# Patient Record
Sex: Male | Born: 1990 | Hispanic: No | Marital: Single | Smoking: Current some day smoker
Health system: Southern US, Community
[De-identification: ages and names within clinical notes are randomized; demographics above are authoritative.]

## PROBLEM LIST (undated history)

## (undated) HISTORY — PX: NO PAST SURGERIES: SHX2092

---
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
## 2010-11-24 NOTE — Discharge Summary (Signed)
NAME:  Chad Rojas, RANGANATHAN NO.:  [id number]   MEDICAL RECORD NO.:  [id number]          PATIENT TYPE:  INP   LOCATION:  0201                          FACILITY:  [facility]   PHYSICIAN:  Lalla Brothers, MDDATE OF BIRTH:  December 10, 1990   DATE OF ADMISSION:  10/01/2007  DATE OF DISCHARGE:  10/07/2007                               DISCHARGE SUMMARY   IDENTIFICATION:  A 20 year old male tenth grade student at [employer] was admitted emergently involuntarily on an  [facility] petition for commitment upon transfer from [facility]  Crisis for inpatient stabilization and treatment of suicide risk and  depression.  The patient had been in the emergency department the  preceding evening after attempting to hang himself with a garden hose.  He was not improved the following day when seen at [facility] and was detained  for inpatient treatment, reporting hearing voices telling him to do  things according to mother even though the patient denied it.  For full  details please see the typed admission assessment.   SYNOPSIS OF PRESENT ILLNESS:  The patient has been residing with  mother's friend, Geraldo Pitter, in order to attend school in that  district.  He has a 28 year old girlfriend creating stressors, even  though he does not acknowledge them.  He grieves the death of  grandfather 5 years ago and grandmother 2 years ago.  He has been self-  medicating with alcohol, cannabis and Xanax and Klonopin over the last 2  months, even though he continues to ask for more medicine to help his  anger.  The patient has little insight into his self-defeating patterns  of maladaptive behaviors and compensations for such.  Parents separated  when the patient was 88 years of age and father is in prison for drugs.  Mother works a lot and worries a lot about the patient and his temper.  Mother is separating from her current husband.  Grades have dropped from  As and Bs to Fs in the  last 6 months.  Though the patient wants help, he  does not have time for it.  Mother reports that father had schizophrenia  as well as addiction to cocaine and other drugs.  Mother has  hypertension and there is family history of hyperlipidemia, diabetes,  cancer and heart problems.  The patient did have some therapy with  Frederich Chick three or four years ago and apparently has been treated  with Concerta and Remeron in the past.  Mother is particularly concerned  about the voices, but the patient will not talk about them after  arrival.  He just wants out of the hospital.   He is allergic to PENICILLIN manifest by rash and is intolerant of  dietary cheese.   INITIAL MENTAL STATUS EXAM:  The patient is right-handed and has intact  neurological exam.  There is no evidence of drug withdrawal syndrome.  He has severe dysphoria and an externalizing, disruptive behavior style  with distortion and denial.  He is not homicidal.  He has no anxiety or  dissociation.  There is no delirium.   LABORATORY  FINDINGS:  CBC was normal with white count 8400, hemoglobin  13.3, MCV of 84 and platelet count 262,000.  Basic metabolic panel  revealed creatinine elevated at 1.79 with upper limit of normal 1.5 with  sodium normal at 142, potassium 4.1, CO2 27, random glucose 86, BUN 12  and calcium 9.9.  Total CK was 1464 with reference range 7-232 units per  liter, though serum myoglobin was normal at 87 with normal less than  111.  Repeat basic metabolic panel was normal including creatinine  normalizing to 0.98, and a 24-hour urine creatinine clearance documented  an elevated 24-hour urine creatinine of 2753 mg per day with upper limit  of normal 2000, so that his rate was 195 mL/min., above the normal range  of 75-125.  Hepatic function panel on admission revealed AST elevated at  77, likely associated with elevated CK, with upper limit of normal 37.  ALT was normal at 47, albumin 4, but indirect  bilirubin was elevated at  1.1 with upper limit of normal 0.9.  Repeat hepatic function panel  revealed indirect bilirubin still elevated at 1.0 with AST down to 40  three days later.  Free T4 was normal at 1.4 with reference range 0.89-  1.8.  TSH was elevated at 10.989 with reference range 0.35-5.5.  Thyroid  antibodies were negative and free T3 was normal at 4.1 with reference  range 2.3-4.2.  RPR was negative and urine probe for gonorrhea and  chlamydia by DNA amplification were both negative.  Urine drug screen  was positive for marijuana metabolites, quantitated and confirmed at 71  ng/mL.  Otherwise urine drug screen was negative with creatinine of 352  mg/dL documenting adequate specimen.  Urinalysis on admission was a  concentrated specimen with specific gravity of 1.032 with trace of  ketones.  GGT was normal at 34.  Ten -hour fasting lipid panel revealed  HDL cholesterol low at 28 with normal being greater than 34 mg/dL, with  LDL normal at 95, total cholesterol 148, VLDL 25 and triglycerides 127.  Hemoglobin A1c was normal at 5.6% with reference range is 4.6-6.1.   HOSPITAL COURSE AND TREATMENT:  General medical exam by Jorje Guild, PA-C,  noted a nasal fracture at age 20 and a fracture of the right thumb at  age 20 without other frequent or pathological fractures.  The patient  considered his grades all right though decreased.  He gained 9 pounds in  the last month by his own review.  He reported some muscle strain in the  low back and right knee.  BMI was 30.9.  He is sexually active.  He was  afebrile throughout the hospital stay with maximum temperature of 98.3.  His height was 174 cm.  Admission weight was 93.5 kg and discharge  weight 94 kg.  Blood pressure initially was 112/56 with heart rate of 56  sitting and 109/47 with heart rate of 51 standing.  On the day of  discharge, supine blood pressure was 136/59 with heart rate of 58 and  standing blood pressure 146/75 with  heart rate of 81.  The patient was  highly stressed, demanding discharge, requiring mother to visit or call  frequently.  He started Wellbutrin, titrated up to 300 mg XL and then  450 mg XL every other day for starting with 150 mg XL.  He started  Zyprexa 10 mg nightly.  On this medication regimen, the patient was able  to cooperate with nutrition, sleep, and dealing with  his back pain  without self-medicating.  His metabolic parameters normalized and he  acknowledged using a gym regimen and likely protein and energy  supplements that may well account for relative thyroid suppression with  elevation of the TSH as well as his high creatine kinase and creatinine  body load that ultimately suggests that he had excess creatinine rather  than any impairment of renal function.  Whether this also contributed to  his psychiatric decompensation could not be more fully determined with  the patient appearing to have a major depressive episode and drug abuse  that caused the symptoms to arise with the metabolic findings being  predominately incidental.  He did not manifest schizophrenia, though we  understand his father had a history of such and mother was worried about  that.  The patient improved on medications and tolerated the completion  of the hospital treatment program, as did mother, even though mother  found herself having to advocate for early discharge because the patient  put pressure on her.  In the end they achieved adequate family  intervention and the patient agreed to move back to mother's home.  He  realized by the time of discharge that his girlfriend, age 51, had been  a bad influence because she has a long history of substance abuse.  They  planned family counseling and activities and they gradually began  processing the losses of grandparents.  Mother and patient processed  that he has several football scholarship offers if he gets his grades  up, and he was motivated by the time  of discharge to function better.  Zyprexa is planned for 1-2 months but Wellbutrin should probably be 6-12  months.  He required no seclusion or restraint by the time of discharge.   FINAL DIAGNOSIS:  AXIS I:  1.  Major depression, single episode, severe.  2.  Oppositional defiant disorder.  3.  Attention deficit-hyperactivity  disorder, combined subtype, moderate severity.  4.  Polysubstance abuse.  5.  Other interpersonal problem.  6.  Other specified family  circumstances.  7.  Parent-child problem.  8.  Noncompliance with  treatment.  AXIS II:  Diagnosis deferred.  AXIS III:  1.  Allergy to PENICILLIN manifested by rash.  2.  Dietary  intolerance to cheese, likely lactose.  3.  Overweight with low HDL  cholesterol of 28 mg/dL.  4.  Elevated creatinine clearance, AST, and CK  as well as elevation of TSH, likely associated with his gym workout  program including supplements.  5.  Mechanical low back pain from  strain.  AXIS IV:  Stressors:  Family severe, acute and chronic; school moderate,  acute and chronic; phase of life severe, acute and chronic; peer  relations extreme, acute and chronic.  AXIS V:  GAF on admission 32 with highest in last year 62 and discharge  GAF was 53.   PLAN:  The patient was discharged to mother in an improved condition,  free of suicidal ideation and having no auditory hallucinations.  He  follows a weight control diet and exercise program for restoring normal  HDL cholesterol and weight as possible.  He has no restrictions on  physical activity other than in need of a physical medicine rehab  program for his low back strain.  He requires no wound care or pain  management.  He will discontinue any energy and protein supplements.  Crisis safety plans are outlined if needed.  He will maintain sobriety  from illicit drugs.  He is discharged on the following medication:  1. Wellbutrin 150 mg XL tablet as three every morning, quantity #90      with one  refill prescribed.  2. Zyprexa 10 mg every bedtime for a couple months, quantity #30 with      one refill prescribed.   They are educated on the medication including FDA guidelines and  warnings.   Psychiatric follow-up will be with [facility] at [phone], with appointment October 08, 2007, at 1100.  They will have  therapy with Frederich Chick at [phone] with appointment October 21, 2007,  at 0830.      Lalla Brothers, MD  Electronically Signed     GEJ/MEDQ  D:  10/09/2007  T:  10/10/2007  Job:  [id number]   cc:   [facility]  [address]  [place], [place] [address]  FAX [phone]   Dr. Waynetta Pean [facility]  [address]  [place], [place] [address]  Valinda Hoar [phone]

## 2011-04-02 LAB — THC (MARIJUANA), URINE, CONFIRMATION: Marijuana, Ur-Confirmation: 71 ng/mL

## 2011-04-02 LAB — HEPATIC FUNCTION PANEL
ALT: 40
AST: 77 — ABNORMAL HIGH
Alkaline Phosphatase: 45 — ABNORMAL LOW
Bilirubin, Direct: 0.1
Bilirubin, Direct: 0.2
Indirect Bilirubin: 1 — ABNORMAL HIGH
Indirect Bilirubin: 1.1 — ABNORMAL HIGH
Total Bilirubin: 1.3 — ABNORMAL HIGH
Total Protein: 7

## 2011-04-02 LAB — URINALYSIS, ROUTINE W REFLEX MICROSCOPIC
Glucose, UA: NEGATIVE
Nitrite: NEGATIVE
Protein, ur: NEGATIVE
pH: 6

## 2011-04-02 LAB — BASIC METABOLIC PANEL
Calcium: 9.1
Calcium: 9.9
Glucose, Bld: 86
Glucose, Bld: 93
Potassium: 3.7
Potassium: 4.1
Sodium: 140
Sodium: 142

## 2011-04-02 LAB — DRUGS OF ABUSE SCREEN W/O ALC, ROUTINE URINE
Amphetamine Screen, Ur: NEGATIVE
Barbiturate Quant, Ur: NEGATIVE
Marijuana Metabolite: POSITIVE — AB
Propoxyphene: NEGATIVE

## 2011-04-02 LAB — T4, FREE: Free T4: 1.4

## 2011-04-02 LAB — DIFFERENTIAL
Basophils Relative: 0
Eosinophils Absolute: 0.2
Monocytes Relative: 9
Neutro Abs: 3.7
Neutrophils Relative %: 44

## 2011-04-02 LAB — CREATININE CLEARANCE, URINE, 24 HOUR
Creatinine, Urine: 423.5
Urine Total Volume-CRCL: 650

## 2011-04-02 LAB — MYOGLOBIN, SERUM: Myoglobin: 87

## 2011-04-02 LAB — GAMMA GT
GGT: 27
GGT: 34

## 2011-04-02 LAB — LIPID PANEL
LDL Cholesterol: 95
Total CHOL/HDL Ratio: 5.3
VLDL: 25

## 2011-04-02 LAB — CBC
MCHC: 34.3
MCV: 84
Platelets: 262
RBC: 4.61
WBC: 8.4

## 2011-04-02 LAB — RPR: RPR Ser Ql: NONREACTIVE

## 2011-04-02 LAB — HEMOGLOBIN A1C: Hgb A1c MFr Bld: 5.6

## 2011-04-02 LAB — GC/CHLAMYDIA PROBE AMP, URINE: GC Probe Amp, Urine: NEGATIVE

## 2011-04-02 LAB — TSH: TSH: 10.989 — ABNORMAL HIGH

## 2011-04-02 LAB — THYROID ANTIBODIES: Thyroperoxidase Ab SerPl-aCnc: <25 U/mL

## 2011-04-02 LAB — T3, FREE: T3, Free: 4.1 (ref 2.3–4.2)

## 2011-04-02 LAB — CK: Total CK: 1464 — ABNORMAL HIGH

## 2012-08-11 ENCOUNTER — Emergency Department: Payer: Self-pay | Admitting: Unknown Physician Specialty

## 2013-04-16 ENCOUNTER — Emergency Department: Payer: Self-pay | Admitting: Emergency Medicine

## 2015-06-10 ENCOUNTER — Emergency Department
Admission: EM | Admit: 2015-06-10 | Discharge: 2015-06-10 | Disposition: A | Discharge status: Home / Self Care | Payer: Medicaid Other | Attending: Emergency Medicine | Admitting: Emergency Medicine

## 2015-06-10 ENCOUNTER — Encounter: Payer: Self-pay | Admitting: *Deleted

## 2015-06-10 DIAGNOSIS — F17200 Nicotine dependence, unspecified, uncomplicated: Secondary | ICD-10-CM | POA: Insufficient documentation

## 2015-06-10 DIAGNOSIS — Y9389 Activity, other specified: Secondary | ICD-10-CM | POA: Diagnosis not present

## 2015-06-10 DIAGNOSIS — Y998 Other external cause status: Secondary | ICD-10-CM | POA: Diagnosis not present

## 2015-06-10 DIAGNOSIS — S0592XA Unspecified injury of left eye and orbit, initial encounter: Secondary | ICD-10-CM | POA: Diagnosis present

## 2015-06-10 DIAGNOSIS — X58XXXA Exposure to other specified factors, initial encounter: Secondary | ICD-10-CM | POA: Insufficient documentation

## 2015-06-10 DIAGNOSIS — S0502XA Injury of conjunctiva and corneal abrasion without foreign body, left eye, initial encounter: Secondary | ICD-10-CM

## 2015-06-10 DIAGNOSIS — Y9289 Other specified places as the place of occurrence of the external cause: Secondary | ICD-10-CM | POA: Insufficient documentation

## 2015-06-10 DIAGNOSIS — Z880 Allergy status to penicillin: Secondary | ICD-10-CM | POA: Diagnosis not present

## 2015-06-10 MED ORDER — GENTAMICIN SULFATE 0.3 % OP SOLN: 1.0000 [drp] | Freq: Four times a day (QID) | OPHTHALMIC | Status: AC

## 2015-06-10 MED ORDER — EYE WASH OPHTH SOLN
OPHTHALMIC | Status: AC
  Administered 2015-06-10: 18:00:00
  Filled 2015-06-10: qty 118

## 2015-06-10 MED ORDER — TETRACAINE HCL 0.5 % OP SOLN
OPHTHALMIC | Status: AC
  Administered 2015-06-10: 18:00:00
  Filled 2015-06-10: qty 2

## 2015-06-10 MED ORDER — FLUORESCEIN SODIUM 1 MG OP STRP
ORAL_STRIP | OPHTHALMIC | Status: AC
  Administered 2015-06-10: 18:00:00
  Filled 2015-06-10: qty 1

## 2015-06-10 NOTE — ED Provider Notes (Signed)
[facility] Emergency Department Provider Note  ____________________________________________  Time seen: Approximately 5:31 PM  I have reviewed the triage vital signs and the nursing notes.   HISTORY  Chief Complaint Eye Pain    HPI Chad Rojas is a 24 y.o. male 5 patient admitted 2 days.  Patient said he woke up yesterday morning with left eye pain. Patient does not remember any trauma. Patient state foreign body sensation in the eye. Patient is painful to open left eye. No palliative measures for complaint. Patient rates pain as a 10 over 10. History reviewed. No pertinent past medical history.  There are no active problems to display for this patient.   History reviewed. No pertinent past surgical history.  Current Outpatient Rx  Name  Route  Sig  Dispense  Refill  . gentamicin (GARAMYCIN) 0.3 % ophthalmic solution   Left Eye   Place 1 drop into the left eye 4 (four) times daily.   5 mL   0     Allergies Penicillins  No family history on file.  Social History Social History  Substance Use Topics  . Smoking status: Current Every Day Smoker  . Smokeless tobacco: None  . Alcohol Use: Yes    Review of Systems Constitutional: No fever/chills Eyes: No visual changes. Left eye pain. ENT: No sore throat. Cardiovascular: Denies chest pain. Respiratory: Denies shortness of breath. Gastrointestinal: No abdominal pain.  No nausea, no vomiting.  No diarrhea.  No constipation. Genitourinary: Negative for dysuria. Musculoskeletal: Negative for back pain. Skin: Negative for rash. Neurological: Negative for headaches, focal weakness or numbness. 10-point ROS otherwise negative.  ____________________________________________   PHYSICAL EXAM:  VITAL SIGNS: ED Triage Vitals  Enc Vitals Group     BP 06/10/15 1559 156/102 mmHg     Pulse Rate 06/10/15 1559 69     Resp 06/10/15 1559 20     Temp 06/10/15 1559 98.3 F (36.8 C)     Temp  Source 06/10/15 1559 Oral     SpO2 06/10/15 1559 97 %     Weight 06/10/15 1559 220 lb (99.791 kg)     Height 06/10/15 1559 6' (1.829 m)     Head Cir --      Peak Flow --      Pain Score 06/10/15 1559 10     Pain Loc --      Pain Edu? --      Excl. in GC? --     Constitutional: Alert and oriented. Well appearing and in no acute distress. Eyes: Unable to measure vision acuity left eye secondary to complain of pain and photophobia. Fluoro- stain revealed center cornea abrasion approximately 2 mm. No visible foreign body. Head: Atraumatic. Nose: No congestion/rhinnorhea. Mouth/Throat: Mucous membranes are moist.  Oropharynx non-erythematous. Neck: No stridor.  No cervical spine tenderness to palpation. Hematological/Lymphatic/Immunilogical: No cervical lymphadenopathy. Cardiovascular: Normal rate, regular rhythm. Grossly normal heart sounds.  Good peripheral circulation. Respiratory: Normal respiratory effort.  No retractions. Lungs CTAB. Gastrointestinal: Soft and nontender. No distention. No abdominal bruits. No CVA tenderness. Genitourinary:  Musculoskeletal: No lower extremity tenderness nor edema.  No joint effusions. Neurologic:  Normal speech and language. No gross focal neurologic deficits are appreciated. No gait instability. Skin:  Skin is warm, dry and intact. No rash noted. Psychiatric: Mood and affect are normal. Speech and behavior are normal.  ____________________________________________   LABS (all labs ordered are listed, but only abnormal results are displayed)  Labs Reviewed - No data to  display ____________________________________________  EKG  ____________________________________________  RADIOLOGY   ____________________________________________   PROCEDURES  Procedure(s) performed: None  Critical Care performed: No  ____________________________________________   INITIAL IMPRESSION / ASSESSMENT AND PLAN / ED COURSE  Pertinent labs & imaging  results that were available during my care of the patient were reviewed by me and considered in my medical decision making (see chart for details).  Left corneal abrasion. Discussed sequela of injury to patient. Patient given prescription for gentamicin ophthalmic eyedrops use as directed. Patient advised [facility] with [facility] if no improvement or worsening of his complaint the next 2-3 days. ____________________________________________   FINAL CLINICAL IMPRESSION(S) / ED DIAGNOSES  Final diagnoses:  Left corneal abrasion, initial encounter       Joni Reining, PA-C 06/10/15 1804  Loleta Rose, MD 06/10/15 2357

## 2015-06-10 NOTE — Discharge Instructions (Signed)
Corneal Abrasion °The cornea is the clear covering at the front and center of the eye. When you look at the colored portion of the eye, you are looking through the cornea. It is a thin tissue made up of layers. The top layer is the most sensitive layer. A corneal abrasion happens if this layer is scratched or an injury causes it to come off.  °HOME CARE °· You may be given drops or a medicated cream. Use the medicine as told by your doctor. °· A pressure patch may be put over the eye. If this is done, follow your doctor's instructions for when to remove the patch. Do not drive or use machines while the eye patch is on. Judging distances is hard to do with a patch on. °· See your doctor for a follow-up exam if you are told to do so. It is very important that you keep this appointment. °GET HELP IF:  °· You have pain, are sensitive to light, and have a scratchy feeling in one eye or both eyes. °· Your pressure patch keeps getting loose. You can blink your eye under the patch. °· You have fluid coming from your eye or the lids stick together in the morning. °· You have the same symptoms in the morning that you did with the first abrasion. This could be days, weeks, or months after the first abrasion healed. °  °This information is not intended to replace advice given to you by your health care provider. Make sure you discuss any questions you have with your health care provider. °  °Document Released: 12/12/2007 Document Revised: 03/16/2015 Document Reviewed: 03/02/2013 °Elsevier Interactive Patient Education ©2016 Elsevier Inc. ° °

## 2015-06-10 NOTE — ED Notes (Signed)
Right eye 20/25, left eye unable to open

## 2016-02-20 ENCOUNTER — Emergency Department (HOSPITAL_COMMUNITY)
Admission: EM | Admit: 2016-02-20 | Discharge: 2016-02-21 | Disposition: A | Discharge status: Other Acute Inpatient Hospital | Payer: Medicaid Other | Attending: Emergency Medicine | Admitting: Emergency Medicine

## 2016-02-20 ENCOUNTER — Encounter (HOSPITAL_COMMUNITY): Payer: Self-pay | Admitting: Emergency Medicine

## 2016-02-20 DIAGNOSIS — F17210 Nicotine dependence, cigarettes, uncomplicated: Secondary | ICD-10-CM | POA: Diagnosis not present

## 2016-02-20 DIAGNOSIS — Z5181 Encounter for therapeutic drug level monitoring: Secondary | ICD-10-CM | POA: Insufficient documentation

## 2016-02-20 DIAGNOSIS — F1910 Other psychoactive substance abuse, uncomplicated: Secondary | ICD-10-CM

## 2016-02-20 DIAGNOSIS — F1210 Cannabis abuse, uncomplicated: Secondary | ICD-10-CM | POA: Insufficient documentation

## 2016-02-20 DIAGNOSIS — R45851 Suicidal ideations: Secondary | ICD-10-CM | POA: Diagnosis not present

## 2016-02-20 DIAGNOSIS — F1410 Cocaine abuse, uncomplicated: Secondary | ICD-10-CM | POA: Diagnosis not present

## 2016-02-20 LAB — ETHANOL

## 2016-02-20 LAB — CBC
HCT: 38 % — ABNORMAL LOW (ref 39.0–52.0)
Hemoglobin: 12.6 g/dL — ABNORMAL LOW (ref 13.0–17.0)
MCH: 29 pg (ref 26.0–34.0)
MCHC: 33.2 g/dL (ref 30.0–36.0)
MCV: 87.4 fL (ref 78.0–100.0)
PLATELETS: 206 10*3/uL (ref 150–400)
RBC: 4.35 MIL/uL (ref 4.22–5.81)
RDW: 12.5 % (ref 11.5–15.5)
WBC: 6.8 10*3/uL (ref 4.0–10.5)

## 2016-02-20 LAB — COMPREHENSIVE METABOLIC PANEL
ALT: 31 U/L (ref 17–63)
AST: 42 U/L — AB (ref 15–41)
Albumin: 3.7 g/dL (ref 3.5–5.0)
Alkaline Phosphatase: 40 U/L (ref 38–126)
Anion gap: 6 (ref 5–15)
BILIRUBIN TOTAL: 1.2 mg/dL (ref 0.3–1.2)
BUN: 8 mg/dL (ref 6–20)
CHLORIDE: 105 mmol/L (ref 101–111)
CO2: 26 mmol/L (ref 22–32)
Calcium: 8.9 mg/dL (ref 8.9–10.3)
Creatinine, Ser: 1.02 mg/dL (ref 0.61–1.24)
Glucose, Bld: 128 mg/dL — ABNORMAL HIGH (ref 65–99)
POTASSIUM: 3.5 mmol/L (ref 3.5–5.1)
Sodium: 137 mmol/L (ref 135–145)
TOTAL PROTEIN: 6.4 g/dL — AB (ref 6.5–8.1)

## 2016-02-20 LAB — RAPID URINE DRUG SCREEN, HOSP PERFORMED
Amphetamines: NOT DETECTED
Barbiturates: NOT DETECTED
Benzodiazepines: POSITIVE — AB
Cocaine: POSITIVE — AB
OPIATES: POSITIVE — AB
Tetrahydrocannabinol: POSITIVE — AB

## 2016-02-20 LAB — ACETAMINOPHEN LEVEL: Acetaminophen (Tylenol), Serum: <10 ug/mL — ABNORMAL LOW (ref 10–30)

## 2016-02-20 LAB — SALICYLATE LEVEL

## 2016-02-20 NOTE — ED Triage Notes (Signed)
Uses cocaine and marijuana; wants to get clean. Having suicidal thoughts. Reports has a plan to hang himself. Fell last night and is having back pain also.

## 2016-02-20 NOTE — ED Notes (Signed)
Given Bag meal

## 2016-02-21 ENCOUNTER — Emergency Department (HOSPITAL_COMMUNITY): Payer: Medicaid Other

## 2016-02-21 ENCOUNTER — Inpatient Hospital Stay (HOSPITAL_COMMUNITY)
Admission: EM | Admit: 2016-02-21 | Discharge: 2016-02-26 | DRG: 897 | Disposition: A | Discharge status: Home / Self Care | Payer: Medicaid Other | Source: Intra-hospital | Attending: Psychiatry | Admitting: Psychiatry

## 2016-02-21 ENCOUNTER — Encounter (HOSPITAL_COMMUNITY): Payer: Self-pay | Admitting: *Deleted

## 2016-02-21 DIAGNOSIS — Z915 Personal history of self-harm: Secondary | ICD-10-CM | POA: Diagnosis not present

## 2016-02-21 DIAGNOSIS — F329 Major depressive disorder, single episode, unspecified: Secondary | ICD-10-CM | POA: Diagnosis present

## 2016-02-21 DIAGNOSIS — F1424 Cocaine dependence with cocaine-induced mood disorder: Principal | ICD-10-CM | POA: Diagnosis present

## 2016-02-21 DIAGNOSIS — R45851 Suicidal ideations: Secondary | ICD-10-CM

## 2016-02-21 DIAGNOSIS — F1920 Other psychoactive substance dependence, uncomplicated: Secondary | ICD-10-CM | POA: Diagnosis present

## 2016-02-21 DIAGNOSIS — F1220 Cannabis dependence, uncomplicated: Secondary | ICD-10-CM | POA: Clinically undetermined

## 2016-02-21 DIAGNOSIS — F1310 Sedative, hypnotic or anxiolytic abuse, uncomplicated: Secondary | ICD-10-CM | POA: Clinically undetermined

## 2016-02-21 DIAGNOSIS — F11229 Opioid dependence with intoxication, unspecified: Secondary | ICD-10-CM | POA: Diagnosis present

## 2016-02-21 DIAGNOSIS — F1410 Cocaine abuse, uncomplicated: Secondary | ICD-10-CM | POA: Diagnosis not present

## 2016-02-21 DIAGNOSIS — F12288 Cannabis dependence with other cannabis-induced disorder: Secondary | ICD-10-CM | POA: Diagnosis present

## 2016-02-21 DIAGNOSIS — F1420 Cocaine dependence, uncomplicated: Secondary | ICD-10-CM | POA: Clinically undetermined

## 2016-02-21 DIAGNOSIS — F1120 Opioid dependence, uncomplicated: Secondary | ICD-10-CM | POA: Clinically undetermined

## 2016-02-21 DIAGNOSIS — F17210 Nicotine dependence, cigarettes, uncomplicated: Secondary | ICD-10-CM | POA: Diagnosis present

## 2016-02-21 DIAGNOSIS — F19929 Other psychoactive substance use, unspecified with intoxication, unspecified: Secondary | ICD-10-CM

## 2016-02-21 DIAGNOSIS — F1994 Other psychoactive substance use, unspecified with psychoactive substance-induced mood disorder: Secondary | ICD-10-CM | POA: Diagnosis not present

## 2016-02-21 DIAGNOSIS — F3289 Other specified depressive episodes: Secondary | ICD-10-CM

## 2016-02-21 MED ORDER — ADULT MULTIVITAMIN W/MINERALS CH
1.0000 | ORAL_TABLET | Freq: Every day | ORAL | Status: DC
  Administered 2016-02-21 – 2016-02-26 (×6): 1 via ORAL
  Filled 2016-02-21 (×10): qty 1

## 2016-02-21 MED ORDER — NICOTINE 21 MG/24HR TD PT24
21.0000 mg | MEDICATED_PATCH | Freq: Every day | TRANSDERMAL | Status: DC
  Filled 2016-02-21 (×3): qty 1

## 2016-02-21 MED ORDER — PNEUMOCOCCAL VAC POLYVALENT 25 MCG/0.5ML IJ INJ: 0.5000 mL | INJECTION | INTRAMUSCULAR | Status: DC

## 2016-02-21 MED ORDER — ACETAMINOPHEN 325 MG PO TABS
650.0000 mg | ORAL_TABLET | Freq: Four times a day (QID) | ORAL | Status: DC | PRN
  Administered 2016-02-21 – 2016-02-24 (×6): 650 mg via ORAL
  Filled 2016-02-21 (×7): qty 2

## 2016-02-21 MED ORDER — LORAZEPAM 1 MG PO TABS
1.0000 mg | ORAL_TABLET | Freq: Four times a day (QID) | ORAL | Status: DC
  Administered 2016-02-21 – 2016-02-22 (×3): 1 mg via ORAL
  Filled 2016-02-21 (×4): qty 1

## 2016-02-21 MED ORDER — ONDANSETRON 4 MG PO TBDP: 4.0000 mg | ORAL_TABLET | Freq: Four times a day (QID) | ORAL | Status: AC | PRN

## 2016-02-21 MED ORDER — THIAMINE HCL 100 MG/ML IJ SOLN: 100.0000 mg | Freq: Once | INTRAMUSCULAR | Status: DC

## 2016-02-21 MED ORDER — LORAZEPAM 1 MG PO TABS: 1.0000 mg | ORAL_TABLET | Freq: Two times a day (BID) | ORAL | Status: DC

## 2016-02-21 MED ORDER — CLONIDINE HCL 0.1 MG PO TABS
0.1000 mg | ORAL_TABLET | ORAL | Status: AC
  Administered 2016-02-23 – 2016-02-25 (×4): 0.1 mg via ORAL
  Filled 2016-02-21 (×4): qty 1

## 2016-02-21 MED ORDER — TRAZODONE HCL 50 MG PO TABS
50.0000 mg | ORAL_TABLET | Freq: Every day | ORAL | Status: DC
  Filled 2016-02-21 (×2): qty 1

## 2016-02-21 MED ORDER — LORAZEPAM 1 MG PO TABS
1.0000 mg | ORAL_TABLET | Freq: Three times a day (TID) | ORAL | Status: DC
  Filled 2016-02-21: qty 1

## 2016-02-21 MED ORDER — LOPERAMIDE HCL 2 MG PO CAPS
2.0000 mg | ORAL_CAPSULE | ORAL | Status: AC | PRN
Start: 2016-02-21 — End: 2016-02-26
  Administered 2016-02-24: 2 mg via ORAL
  Filled 2016-02-21: qty 1

## 2016-02-21 MED ORDER — METHOCARBAMOL 500 MG PO TABS
500.0000 mg | ORAL_TABLET | Freq: Three times a day (TID) | ORAL | Status: AC | PRN
  Administered 2016-02-21 – 2016-02-26 (×6): 500 mg via ORAL
  Filled 2016-02-21 (×6): qty 1

## 2016-02-21 MED ORDER — VITAMIN B-1 100 MG PO TABS
100.0000 mg | ORAL_TABLET | Freq: Every day | ORAL | Status: DC
  Administered 2016-02-22 – 2016-02-26 (×5): 100 mg via ORAL
  Filled 2016-02-21 (×8): qty 1

## 2016-02-21 MED ORDER — KETOROLAC TROMETHAMINE 60 MG/2ML IM SOLN
30.0000 mg | Freq: Once | INTRAMUSCULAR | Status: DC
  Filled 2016-02-21: qty 2

## 2016-02-21 MED ORDER — DIPHENHYDRAMINE HCL 50 MG/ML IJ SOLN
50.0000 mg | Freq: Once | INTRAMUSCULAR | Status: AC
  Administered 2016-02-21: 50 mg via INTRAMUSCULAR
  Filled 2016-02-21 (×2): qty 1

## 2016-02-21 MED ORDER — ALUM & MAG HYDROXIDE-SIMETH 200-200-20 MG/5ML PO SUSP: 30.0000 mL | ORAL | Status: DC | PRN

## 2016-02-21 MED ORDER — VITAMIN B-1 100 MG PO TABS
100.0000 mg | ORAL_TABLET | Freq: Once | ORAL | Status: AC
  Administered 2016-02-21: 100 mg via ORAL
  Filled 2016-02-21 (×2): qty 1

## 2016-02-21 MED ORDER — LORAZEPAM 2 MG/ML IJ SOLN
1.0000 mg | Freq: Once | INTRAMUSCULAR | Status: AC
  Administered 2016-02-21: 1 mg via INTRAMUSCULAR
  Filled 2016-02-21: qty 1

## 2016-02-21 MED ORDER — HYDROXYZINE HCL 25 MG PO TABS
25.0000 mg | ORAL_TABLET | Freq: Four times a day (QID) | ORAL | Status: AC | PRN
  Administered 2016-02-21 – 2016-02-24 (×6): 25 mg via ORAL
  Filled 2016-02-21 (×7): qty 1

## 2016-02-21 MED ORDER — ACETAMINOPHEN 500 MG PO TABS
1000.0000 mg | ORAL_TABLET | Freq: Once | ORAL | Status: AC
  Administered 2016-02-21: 1000 mg via ORAL
  Filled 2016-02-21: qty 2

## 2016-02-21 MED ORDER — NAPROXEN 500 MG PO TABS
500.0000 mg | ORAL_TABLET | Freq: Two times a day (BID) | ORAL | Status: AC | PRN
  Administered 2016-02-21 – 2016-02-25 (×5): 500 mg via ORAL
  Filled 2016-02-21 (×5): qty 1

## 2016-02-21 MED ORDER — DICYCLOMINE HCL 20 MG PO TABS
20.0000 mg | ORAL_TABLET | Freq: Four times a day (QID) | ORAL | Status: AC | PRN
  Administered 2016-02-24 – 2016-02-25 (×2): 20 mg via ORAL
  Filled 2016-02-21 (×2): qty 1

## 2016-02-21 MED ORDER — CLONIDINE HCL 0.1 MG PO TABS
0.1000 mg | ORAL_TABLET | Freq: Every day | ORAL | Status: DC
  Administered 2016-02-26: 0.1 mg via ORAL
  Filled 2016-02-21 (×2): qty 1

## 2016-02-21 MED ORDER — ZIPRASIDONE MESYLATE 20 MG IM SOLR
20.0000 mg | Freq: Once | INTRAMUSCULAR | Status: AC
  Administered 2016-02-21: 20 mg via INTRAMUSCULAR
  Filled 2016-02-21 (×2): qty 20

## 2016-02-21 MED ORDER — LORAZEPAM 1 MG PO TABS: 1.0000 mg | ORAL_TABLET | Freq: Every day | ORAL | Status: DC

## 2016-02-21 MED ORDER — LORAZEPAM 1 MG PO TABS
1.0000 mg | ORAL_TABLET | Freq: Four times a day (QID) | ORAL | Status: AC | PRN
  Filled 2016-02-21: qty 1

## 2016-02-21 MED ORDER — MAGNESIUM HYDROXIDE 400 MG/5ML PO SUSP: 30.0000 mL | Freq: Every day | ORAL | Status: DC | PRN

## 2016-02-21 MED ORDER — CLONIDINE HCL 0.1 MG PO TABS
0.1000 mg | ORAL_TABLET | Freq: Four times a day (QID) | ORAL | Status: AC
  Administered 2016-02-21 – 2016-02-23 (×6): 0.1 mg via ORAL
  Filled 2016-02-21 (×10): qty 1

## 2016-02-21 MED ORDER — RISPERIDONE 1 MG PO TABS
1.0000 mg | ORAL_TABLET | Freq: Every day | ORAL | Status: DC
  Filled 2016-02-21: qty 1

## 2016-02-21 NOTE — ED Notes (Signed)
Ordered breakfast tray  

## 2016-02-21 NOTE — H&P (Signed)
Psychiatric Admission Assessment Adult  Patient Identification: Chad Rojas MRN:  [id number]  Date of Evaluation:  02/21/2016  Chief Complaint:  MDD , drug relapse.  Principal Diagnosis: Polysubstance dependence including opioid drug with daily use ([place])  Diagnosis:   Patient Active Problem List   Diagnosis Date Noted  . Polysubstance dependence including opioid drug with daily use [facility]) [F19.20] 02/21/2016   History of Present Illness: This is an admission assessment for Chad Rojas, a 25 year old Caucasian male with hx of polysubstance dependence. Admitted to the [facility] adult unit with complaint of recent relapse on drugs & anger issues. He is currently seeking detoxification/mood stabilization treatments. During this assessment, Jaysun reports, "My fiance took me to the [facility] hospital ED yesterday. My back was hurting after I fell down some flight of steps yesterday. I was arguing with my old brother at the time I fell down the flight of stairs. I'm feeling very depressed for few years now. I have never been treated for depression. My brother has always bullied me. He has never cared about me, but care about his friends & his drugs. I use Marijuana & Cocaine daily most of the time. I have been using for a long time. I took some Xanax & Vicodin yesterday. I drink too, but have not had a drink in about a week. I drink once a week. I have not really had any sobriety from substances. I have hx of suicide attempt by hanging when I was 18 (2009). I was hospitalized in this hospital at the adolescence unit. I have no friends, no one wants to be around me. I get very depressed & angry easily. When I talk, people always think that I was yelling. I can't control my anger. I like to fight with people. I need anger management. My mind/thoughts races till they get in the overdrive. ".  Associated Signs/Symptoms:  Depression Symptoms:  depressed mood, insomnia, psychomotor  agitation, anxiety,  (Hypo) Manic Symptoms:  Hallucinations, Impulsivity, Irritable Mood, Labiality of Mood,  Anxiety Symptoms:  Excessive Worry,  Psychotic Symptoms:  Hallucinations: Auditory Visual  PTSD Symptoms: None reported  Total Time spent with patient: 1 hour  Past Psychiatric History: Anger issues, suicide attempt by hanging 2009.  Is the patient at risk to self? Yes.   , able to contract for safety. Has the patient been a risk to self in the past 6 months? No.  Has the patient been a risk to self within the distant past? Yes.   ,(Hx. Attempted suicide by hanging at age 31). Is the patient a risk to others? No.  Has the patient been a risk to others in the past 6 months? No.  Has the patient been a risk to others within the distant past? No.   Prior Inpatient Therapy:   Prior Outpatient Therapy:    Alcohol Screening: 1. How often do you have a drink containing alcohol?: 2 to 3 times a week 2. How many drinks containing alcohol do you have on a typical day when you are drinking?: 5 or 6 3. How often do you have six or more drinks on one occasion?: Weekly Preliminary Score: 5 4. How often during the last year have you found that you were not able to stop drinking once you had started?: Weekly 5. How often during the last year have you failed to do what was normally expected from you becasue of drinking?: Weekly 6. How often during the last year have you needed a  first drink in the morning to get yourself going after a heavy drinking session?: Never 7. How often during the last year have you had a feeling of guilt of remorse after drinking?: Monthly 8. How often during the last year have you been unable to remember what happened the night before because you had been drinking?: Never 9. Have you or someone else been injured as a result of your drinking?: Yes, during the last year 10. Has a relative or friend or a doctor or another health worker been concerned about your  drinking or suggested you cut down?: Yes, during the last year Alcohol Use Disorder Identification Test Final Score (AUDIT): 24 Brief Intervention: Yes  Substance Abuse History in the last 12 months:  Yes.    Consequences of Substance Abuse: Medical Consequences:  Liver damage, Possible death by overdose Legal Consequences:  Arrests, jail time, Loss of driving privilege. Family Consequences:  Family discord, divorce and or separation.  Previous Psychotropic Medications: Yes, unable to remember the name.  Psychological Evaluations: Yes   Past Medical History: History reviewed. No pertinent past medical history. History reviewed. No pertinent surgical history.  Family History: History reviewed. No pertinent family history.  Family Psychiatric  History: None reported  Tobacco Screening: Have you used any form of tobacco in the last 30 days? (Cigarettes, Smokeless Tobacco, Cigars, and/or Pipes): Yes Tobacco use, Select all that apply: 4 or less cigarettes per day Are you interested in Tobacco Cessation Medications?: No, patient refused Counseled patient on smoking cessation including recognizing danger situations, developing coping skills and basic information about quitting provided: Refused/Declined practical counseling  Social History:  History  Alcohol Use  . Yes    Comment: last use last week/drinks approx 1/5th liquor a week     History  Drug Use  . Types: Cocaine, Marijuana    Additional Social History:  Allergies:   Allergies  Allergen Reactions  . Penicillins Hives   Lab Results:  Results for orders placed or performed during the hospital encounter of 02/20/16 (from the past 48 hour(s))  Rapid urine drug screen (hospital performed)     Status: Abnormal   Collection Time: 02/20/16  7:45 PM  Result Value Ref Range   Opiates POSITIVE (A) NONE DETECTED   Cocaine POSITIVE (A) NONE DETECTED   Benzodiazepines POSITIVE (A) NONE DETECTED   Amphetamines NONE DETECTED  NONE DETECTED   Tetrahydrocannabinol POSITIVE (A) NONE DETECTED   Barbiturates NONE DETECTED NONE DETECTED    Comment:        DRUG SCREEN FOR MEDICAL PURPOSES ONLY.  IF CONFIRMATION IS NEEDED FOR ANY PURPOSE, NOTIFY LAB WITHIN 5 DAYS.        LOWEST DETECTABLE LIMITS FOR URINE DRUG SCREEN Drug Class       Cutoff (ng/mL) Amphetamine      1000 Barbiturate      200 Benzodiazepine   623 Tricyclics       762 Opiates          300 Cocaine          300 THC              50   Comprehensive metabolic panel     Status: Abnormal   Collection Time: 02/20/16  7:48 PM  Result Value Ref Range   Sodium 137 135 - 145 mmol/L   Potassium 3.5 3.5 - 5.1 mmol/L   Chloride 105 101 - 111 mmol/L   CO2 26 22 - 32 mmol/L   Glucose, Bld  128 (H) 65 - 99 mg/dL   BUN 8 6 - 20 mg/dL   Creatinine, Ser 1.02 0.61 - 1.24 mg/dL   Calcium 8.9 8.9 - 10.3 mg/dL   Total Protein 6.4 (L) 6.5 - 8.1 g/dL   Albumin 3.7 3.5 - 5.0 g/dL   AST 42 (H) 15 - 41 U/L   ALT 31 17 - 63 U/L   Alkaline Phosphatase 40 38 - 126 U/L   Total Bilirubin 1.2 0.3 - 1.2 mg/dL   GFR calc non Af Amer >60 >60 mL/min   GFR calc Af Amer >60 >60 mL/min    Comment: (NOTE) The eGFR has been calculated using the CKD EPI equation. This calculation has not been validated in all clinical situations. eGFR's persistently <60 mL/min signify possible Chronic Kidney Disease.    Anion gap 6 5 - 15  Ethanol     Status: None   Collection Time: 02/20/16  7:48 PM  Result Value Ref Range   Alcohol, Ethyl (B) <5 <5 mg/dL    Comment: REPEATED TO VERIFY        LOWEST DETECTABLE LIMIT FOR SERUM ALCOHOL IS 5 mg/dL FOR MEDICAL PURPOSES ONLY   Salicylate level     Status: None   Collection Time: 02/20/16  7:48 PM  Result Value Ref Range   Salicylate Lvl <0.3 2.8 - 30.0 mg/dL  Acetaminophen level     Status: Abnormal   Collection Time: 02/20/16  7:48 PM  Result Value Ref Range   Acetaminophen (Tylenol), Serum <10 (L) 10 - 30 ug/mL    Comment:         THERAPEUTIC CONCENTRATIONS VARY SIGNIFICANTLY. A RANGE OF 10-30 ug/mL MAY BE AN EFFECTIVE CONCENTRATION FOR MANY PATIENTS. HOWEVER, SOME ARE BEST TREATED AT CONCENTRATIONS OUTSIDE THIS RANGE. ACETAMINOPHEN CONCENTRATIONS >150 ug/mL AT 4 HOURS AFTER INGESTION AND >50 ug/mL AT 12 HOURS AFTER INGESTION ARE OFTEN ASSOCIATED WITH TOXIC REACTIONS.   cbc     Status: Abnormal   Collection Time: 02/20/16  7:48 PM  Result Value Ref Range   WBC 6.8 4.0 - 10.5 K/uL   RBC 4.35 4.22 - 5.81 MIL/uL   Hemoglobin 12.6 (L) 13.0 - 17.0 g/dL   HCT 38.0 (L) 39.0 - 52.0 %   MCV 87.4 78.0 - 100.0 fL   MCH 29.0 26.0 - 34.0 pg   MCHC 33.2 30.0 - 36.0 g/dL   RDW 12.5 11.5 - 15.5 %   Platelets 206 150 - 400 K/uL    Blood Alcohol level:  Lab Results  Component Value Date   ETH <5 75/43/6067   Metabolic Disorder Labs:  Lab Results  Component Value Date   HGBA1C  10/04/2007    5.6 (NOTE)   The ADA recommends the following therapeutic goals for glycemic   control related to Hgb A1C measurement:   Goal of Therapy:   < 7.0% Hgb A1C   Action Suggested:  > 8.0% Hgb A1C   Ref:  Diabetes Care, 22, Suppl. 1, 1999   MPG 122 10/04/2007   No results found for: PROLACTIN Lab Results  Component Value Date   CHOL  10/04/2007    148        ATP III CLASSIFICATION:  <200     mg/dL   Desirable  200-239  mg/dL   Borderline High  >=240    mg/dL   High   TRIG 127 10/04/2007   HDL 28 (L) 10/04/2007   CHOLHDL 5.3 10/04/2007   VLDL 25 10/04/2007  [place]  10/04/2007    95        Total Cholesterol/HDL:CHD Risk Coronary Heart Disease Risk Table                     Men   Women  1/2 Average Risk   3.4   3.3   Current Medications: Current Facility-Administered Medications  Medication Dose Route Frequency Provider Last Rate Last Dose  . acetaminophen (TYLENOL) tablet 650 mg  650 mg Oral Q6H PRN Encarnacion Slates, NP      . alum & mag hydroxide-simeth (MAALOX/MYLANTA) 200-200-20 MG/5ML suspension 30 mL  30 mL Oral  Q4H PRN Encarnacion Slates, NP      . cloNIDine (CATAPRES) tablet 0.1 mg  0.1 mg Oral QID Encarnacion Slates, NP   0.1 mg at 02/21/16 1220   Followed by  . [START ON 02/23/2016] cloNIDine (CATAPRES) tablet 0.1 mg  0.1 mg Oral BH-qamhs Encarnacion Slates, NP       Followed by  . [START ON 02/26/2016] cloNIDine (CATAPRES) tablet 0.1 mg  0.1 mg Oral QAC breakfast Encarnacion Slates, NP      . dicyclomine (BENTYL) tablet 20 mg  20 mg Oral Q6H PRN Encarnacion Slates, NP      . hydrOXYzine (ATARAX/VISTARIL) tablet 25 mg  25 mg Oral Q6H PRN Encarnacion Slates, NP      . loperamide (IMODIUM) capsule 2-4 mg  2-4 mg Oral PRN Encarnacion Slates, NP      . LORazepam (ATIVAN) tablet 1 mg  1 mg Oral Q6H PRN Encarnacion Slates, NP      . LORazepam (ATIVAN) tablet 1 mg  1 mg Oral QID Encarnacion Slates, NP   1 mg at 02/21/16 1219   Followed by  . [START ON 02/22/2016] LORazepam (ATIVAN) tablet 1 mg  1 mg Oral TID Encarnacion Slates, NP       Followed by  . [START ON 02/23/2016] LORazepam (ATIVAN) tablet 1 mg  1 mg Oral BID Encarnacion Slates, NP       Followed by  . [START ON 02/25/2016] LORazepam (ATIVAN) tablet 1 mg  1 mg Oral Daily Encarnacion Slates, NP      . magnesium hydroxide (MILK OF MAGNESIA) suspension 30 mL  30 mL Oral Daily PRN Encarnacion Slates, NP      . methocarbamol (ROBAXIN) tablet 500 mg  500 mg Oral Q8H PRN Encarnacion Slates, NP      . multivitamin with minerals tablet 1 tablet  1 tablet Oral Daily Encarnacion Slates, NP   1 tablet at 02/21/16 1220  . naproxen (NAPROSYN) tablet 500 mg  500 mg Oral BID PRN Encarnacion Slates, NP      . ondansetron (ZOFRAN-ODT) disintegrating tablet 4 mg  4 mg Oral Q6H PRN Encarnacion Slates, NP      . Derrill Memo ON 02/22/2016] pneumococcal 23 valent vaccine (PNU-IMMUNE) injection 0.5 mL  0.5 mL Intramuscular Tomorrow-1000 Encarnacion Slates, NP      . risperiDONE (RISPERDAL) tablet 1 mg  1 mg Oral QHS Encarnacion Slates, NP      . Derrill Memo ON 02/22/2016] thiamine (VITAMIN B-1) tablet 100 mg  100 mg Oral Daily Encarnacion Slates, NP      . traZODone (DESYREL)  tablet 50 mg  50 mg Oral QHS Encarnacion Slates, NP       PTA Medications: Prescriptions Prior to Admission  Medication Sig  Dispense Refill Last Dose  . Buprenorphine HCl-Naloxone HCl (SUBOXONE) 8-2 MG FILM Place 1 Film under the tongue 2 (two) times daily.   Past Week at Unknown time   Musculoskeletal: Strength & Muscle Tone: within normal limits Gait & Station: normal Patient leans: N/A  Psychiatric Specialty Exam: Physical Exam  Constitutional: He is oriented to person, place, and time. He appears well-developed.  HENT:  Head: Normocephalic.  Eyes: Pupils are equal, round, and reactive to light.  Neck: Normal range of motion.  Cardiovascular: Normal rate.   Respiratory: Effort normal and breath sounds normal.  GI: Soft.  Genitourinary:  Genitourinary Comments: Denies any issues in this area  Musculoskeletal: Normal range of motion.  Neurological: He is alert and oriented to person, place, and time.  Skin: Skin is warm and dry.  Psychiatric: His speech is normal. Thought content normal. His mood appears anxious. His affect is angry and blunt. His affect is not labile and not inappropriate. He is withdrawn and actively hallucinating. Cognition and memory are normal. He expresses impulsivity. He exhibits a depressed mood.    Review of Systems  Constitutional: Positive for chills, diaphoresis and malaise/fatigue.  HENT: Negative.   Eyes: Positive for blurred vision.  Respiratory: Negative.   Cardiovascular: Negative.   Gastrointestinal: Positive for nausea.  Genitourinary: Negative.   Musculoskeletal: Negative.   Skin: Negative.   Neurological: Positive for dizziness.  Endo/Heme/Allergies: Negative.   Psychiatric/Behavioral: Positive for depression, hallucinations (Auditory/visual hallucination) and substance abuse (Polysubstance dependence). Negative for memory loss and suicidal ideas. The patient is nervous/anxious and has insomnia.     Blood pressure 125/76, pulse 75,  temperature 98.7 F (37.1 C), temperature source Oral, resp. rate 16, height 5' 11"  (1.803 m), weight 89.8 kg (198 lb), SpO2 99 %.Body mass index is 27.62 kg/m.  General Appearance: Fairly Groomed  Eye Contact:  Poor  Speech:  Slow  Volume:  Decreased  Mood:  Depressed  Affect:  Restricted  Thought Process:  Coherent and Goal Directed  Orientation:  Full (Time, Place, and Person)  Thought Content:  Logical reports occasional AH of his grandmother, denies CAH, denies VH  Suicidal Thoughts:  Yes.  without intent/plan (positive for plans as above but no intent)  Homicidal Thoughts:  No  Memory:  impaired  Judgement:  Impaired  Insight:  Present  Psychomotor Activity:  Decreased  Concentration:  Concentration: Poor and Attention Span: Poor  Recall:  Poor  Fund of Knowledge:  Fair  Language:  Fair  Akathisia:  No  Handed:  Right  AIMS (if indicated):     Assets:  Desire for Improvement  ADL's:  Intact  Cognition:  WNL  Sleep:        Treatment Plan Summary: Daily contact with patient to assess and evaluate symptoms and progress in treatment and Medication management:1. Admit for crisis management and stabilization, estimated length of stay 3-5 days.  2. Medication management to reduce current symptoms to base line and improve the patient's overall level of functioning:See MAR for Medication lists. 3. Treat health problems as indicated.  4. Develop treatment plan to decrease risk of relapse upon discharge and the need for readmission.  5. Psycho-social education regarding relapse prevention and self care.  6. Health care follow up as needed for medical problems.  7. Review, reconcile, and reinstate any pertinent home medications for other health issues where appropriate. 8. Call for consults with hospitalist for any additional specialty patient care services as needed.  Observation Level/Precautions:  15 minute  checks  Laboratory:  Per ED, UDS + for Opioid, benzodiazepine, Cocaine  & THC. Elevated liver enzyme.  Psychotherapy: Group sessions.   Medications: See MAR for medication lists.  Consultations: As needed    Discharge Concerns: Safety, Mood stabilization.   Estimated LOS: 3-5 days.  Other: Admit to [facility].    I certify that inpatient services furnished can reasonably be expected to improve the patient's condition.    Encarnacion Slates, NP, PMHNP, FNP-BC 8/15/20172:40 PM

## 2016-02-21 NOTE — ED Provider Notes (Signed)
[facility]-EMERGENCY DEPT Provider Note   CSN: [id number] Arrival date & time: 02/20/16  1818     History   Chief Complaint Chief Complaint  Patient presents with  . Suicidal    HPI Chad Rojas is a 25 y.o. male.  Patient is a 25 year old male with history of polysubstance abuse. He presents for evaluation of a relapse of drug use. He reports he is "going through some things" and is having feelings of suicide. He presents for evaluation of this. He reports use of cocaine, marijuana.   The history is provided by the patient.    History reviewed. No pertinent past medical history.  There are no active problems to display for this patient.   History reviewed. No pertinent surgical history.     Home Medications    Prior to Admission medications   Not on File    Family History History reviewed. No pertinent family history.  Social History Social History  Substance Use Topics  . Smoking status: Current Every Day Smoker    Types: Cigarettes  . Smokeless tobacco: Never Used  . Alcohol use Yes     Comment: last use last week     Allergies   Penicillins   Review of Systems Review of Systems  All other systems reviewed and are negative.    Physical Exam Updated Vital Signs BP 126/63 (BP Location: Right Arm)   Pulse 70   Temp 98.1 F (36.7 C) (Oral)   Resp 14   Ht 5\' 11"  (1.803 m)   Wt 206 lb (93.4 kg)   SpO2 97%   BMI 28.73 kg/m   Physical Exam  Constitutional: He is oriented to person, place, and time. He appears well-developed and well-nourished. No distress.  HENT:  Head: Normocephalic and atraumatic.  Mouth/Throat: Oropharynx is clear and moist.  Neck: Normal range of motion. Neck supple.  Cardiovascular: Normal rate and regular rhythm.  Exam reveals no friction rub.   No murmur heard. Pulmonary/Chest: Effort normal and breath sounds normal. No respiratory distress. He has no wheezes. He has no rales.  Abdominal: Soft. Bowel sounds are  normal. He exhibits no distension. There is no tenderness.  Musculoskeletal: Normal range of motion. He exhibits no edema.  Neurological: He is alert and oriented to person, place, and time. Coordination normal.  Skin: Skin is warm and dry. He is not diaphoretic.  Psychiatric: His speech is normal and behavior is normal. Judgment normal. Cognition and memory are normal. He exhibits a depressed mood. He expresses suicidal ideation.  Nursing note and vitals reviewed.    ED Treatments / Results  Labs (all labs ordered are listed, but only abnormal results are displayed) Labs Reviewed  COMPREHENSIVE METABOLIC PANEL - Abnormal; Notable for the following:       Result Value   Glucose, Bld 128 (*)    Total Protein 6.4 (*)    AST 42 (*)    All other components within normal limits  ACETAMINOPHEN LEVEL - Abnormal; Notable for the following:    Acetaminophen (Tylenol), Serum <10 (*)    All other components within normal limits  CBC - Abnormal; Notable for the following:    Hemoglobin 12.6 (*)    HCT 38.0 (*)    All other components within normal limits  URINE RAPID DRUG SCREEN, HOSP PERFORMED - Abnormal; Notable for the following:    Opiates POSITIVE (*)    Cocaine POSITIVE (*)    Benzodiazepines POSITIVE (*)    Tetrahydrocannabinol POSITIVE (*)  All other components within normal limits  ETHANOL  SALICYLATE LEVEL    EKG  EKG Interpretation None       Radiology No results found.  Procedures Procedures (including critical care time)  Medications Ordered in ED Medications - No data to display   Initial Impression / Assessment and Plan / ED Course  I have reviewed the triage vital signs and the nursing notes.  Pertinent labs & imaging results that were available during my care of the patient were reviewed by me and considered in my medical decision making (see chart for details).  Clinical Course    Patient presents with complaints of depression, suicidal ideation,  and drug abuse. He is presenting requesting help for all of these. His medical workup is unremarkable. He has been evaluated by TTS and felt to be a candidate for inpatient treatment. These arrangements are ongoing.  Final Clinical Impressions(s) / ED Diagnoses   Final diagnoses:  None    New Prescriptions New Prescriptions   No medications on file     Geoffery Lyonsouglas Imo [place], MD 02/21/16 [phone]

## 2016-02-21 NOTE — Progress Notes (Addendum)
Patient ID: Chad Rojas, male   DOB: 05/15/1991, 25 y.o.   MRN: [id number]  1:1 Note: 1:1 ordered at 2200, prn medications ordered as well. Pt lying on floor in room, not in any current distress.  States "I'm okay." Pt told that someone would be with him now. Casimiro NeedleShalonda, MHT temporarily outside door frame with eyes on patient. Bunnie Pionori, AC and Brook, Consulting civil engineerCharge RN notified of need for Public relations account executivepermanent sitter. Charge RN sits with patient until full time sitter arrives. Pt in no current distress, lying in bed with eyes closed.

## 2016-02-21 NOTE — Progress Notes (Signed)
Pt admitted voluntary with si thoughts to hang or cut wrist. Pt fell down steps in the past week and injured his back while arguing at his brother's house and being intoxicated. Pt reports that he drinks 1/5th liquor weekly. He has hx of cocaine and THC use. He tests positive for opiates, cocaine, benzos and THC. He [facility]. Pt says that he has lost 20lbs. He has 4 children 10 and under that live with the children's mother. Pt has a hx of auditory and visual hallucinations as seeing people and shadows and denies currently. Pt is currently homeless and recently argued with his mother. He works in Holiday representativeconstruction. Pt was a pt [facility] with a suicide attempt in the 11th grade and quit school at that time. He says that he has anger issues and legal charges that he would not disclose. He was started on ativan and librium protocol.

## 2016-02-21 NOTE — ED Notes (Signed)
Dr. Zavits at bedside   

## 2016-02-21 NOTE — ED Notes (Signed)
Pt denies suicidal thoughts or feelings this AM. Pt states "I didn't even have dreams".

## 2016-02-21 NOTE — Progress Notes (Signed)
Patient ID: Chad Rojas, male   DOB: 02/20/1991, 25 y.o.   MRN: [id number]   Pt currently presents with a flat affect and labile behavior. Pt states "I would like to be started on something for depression and anxiety." Pt preoccupied with back pain, visitor concerned about CT results. Permission given to Clinical research associatewriter, pt and visitor educated on inflammation and CT scan results.   Pt provided with as needed and scheduled medications per providers orders. Pt given ice packs to reduce inflammation. Pt's labs and vitals were monitored throughout the night. Pt supported emotionally and encouraged to express concerns and questions. Pt educated on medications.  Pt's safety ensured with 15 minute and environmental checks. Pt currently denies SI/HI and A/V hallucinations. Pt verbally agrees to seek staff if SI/HI or A/VH occurs and to consult with staff before acting on any harmful thoughts. Will continue POC.

## 2016-02-21 NOTE — ED Notes (Signed)
MD at bedside. 

## 2016-02-21 NOTE — Tx Team (Addendum)
Initial Interdisciplinary Treatment Plan   PATIENT STRESSORS: Legal issue Marital or family conflict Substance abuse   PATIENT STRENGTHS: Ability for insight General fund of knowledge Physical Health Work skills   PROBLEM LIST: Problem List/Patient Goals Date to be addressed Date deferred Reason deferred Estimated date of resolution  Substance abuse 02/21/16     si 02/21/16     anger 02/21/16     "I want to figure out what's wrong with my back"                                     DISCHARGE CRITERIA:  Ability to meet basic life and health needs Adequate post-discharge living arrangements Improved stabilization in mood, thinking, and/or behavior Medical problems require only outpatient monitoring Motivation to continue treatment in a less acute level of care Need for constant or close observation no longer present Reduction of life-threatening or endangering symptoms to within safe limits Safe-care adequate arrangements made Verbal commitment to aftercare and medication compliance Withdrawal symptoms are absent or subacute and managed without 24-hour nursing intervention  PRELIMINARY DISCHARGE PLAN: Attend aftercare/continuing care group Placement in alternative living arrangements  PATIENT/FAMIILY INVOLVEMENT: This treatment plan has been presented to and reviewed with the patient, Chad Rojas, and/or family member,  The patient and family have been given the opportunity to ask questions and make suggestions.  Beatrix ShipperWright, Jan Martin 02/21/2016, 1:33 PM

## 2016-02-21 NOTE — ED Notes (Signed)
Patient transported to X-ray 

## 2016-02-21 NOTE — Progress Notes (Addendum)
Patient ID: Chad Rojas, male   DOB: 01/24/1991, 25 y.o.   MRN: [id number]   02/21/16 2200 Found half of a suboxone strip in patients toilet paper roll during routine environmental checks. Charge, RN notified, instructions to put in a note and a SZP in system. Simon, PA notified, instructed to get vitals and complete a neuro check. Pt sitting in dayroom, eyes closed and head down. Pt vitals taken, see flowsheet. Neuro check completed, alert and oriented times 4. Writer discussed with patient the findings of the environmental and that the Suboxone was confiscated. Pt became angry and stated "I want to leave then if you guys aren't giving me my medications. I wanted to take the last half tonight, that was my plan. I need my medicine. I mean they told me I wouldn't get the medication here before I came but..." Pt offered prn medications, offered emotional support and offered to get charge nurse or provider to speak to him about POC. Pt refuses states "I don't want to talk to anybody." Pt walks to door in hallway and yanks on door, starts pacing down the hallway. All other pt on hall were escorted into dayroom. Provider and charge nurse approach patient in hallway, offers to talk to patient and pt states "Ya'll don't want to talk to me, you guys don't have anything to offer me." Pt walks back to room. Pt starts yelling, "F**k!" And loud bangs heard from the hallway. Eyes put on patient. Standing at [employer], leaning over. Pt asks to refrain from hitting furniture, pt nods and grunts. Provider consulted in nursing station about plans for patient safety, [facility] notified. Pt notification alarm goes off in bathroom. Therapist, sportsCharge nurse and writer go to door, open to door and no one is seen. They speak to patient in the doorway. This Clinical research associatewriter speaks to patient and patient states "I don't need your help." Raspy breathing then heard and pt seen sitting on bathroom floor with sheet around his neck, the other end stuck in the edge of  the bathroom door. Pt leaning forward. Sheet untied, some redness noted, skin on neck intact. Skin on Left hand knuckles broken, skin over knuckles on R hand reddened but intact. Vitals attempted, pt refused. Writer asked to look at knuckles and give bandages, pt states "I don't want any help from you, now you have seen the real me." Provider, [facility] and MD notified. 1:1 ordered, prn medications ordered and administered by fellow nurse. Pt in room states "I'm okay." Respiration unlabored and regular. Pt in no current distress. Half of suboxone strip discarded in sharps container, verified by Jarold MottoPenny Nuttal, RN.   02/21/16 10:58 PM Pt lying in bed, medications administered. Tolerated well. See MAR. Will continue to Monitor.

## 2016-02-21 NOTE — ED Notes (Signed)
Pt was informed by this RN that [facility]; Pt was ok with that;Report given to next shift RN Lynnze.

## 2016-02-21 NOTE — ED Notes (Signed)
Pt given heat pack for back.

## 2016-02-21 NOTE — Progress Notes (Signed)
Recreation Therapy Notes    Animal-Assisted Activity (AAA) Program Checklist/Progress Notes Patient Eligibility Criteria Checklist & Daily Group note for Rec TxIntervention  Date: 08.15.2017 Time: 2:45pm Location: 400 Hall Dayroom   AAA/T Program Assumption of Risk Form signed by Patient/ or Parent Legal Guardian Yes  Patient is free of allergies or sever asthma Yes  Patient reports no fear of animals Yes  Patient reports no history of cruelty to animals Yes  Patient understands his/her participation is voluntary Yes  Behavioral Response: Did not attend.   Tameia Rafferty L Syd Newsome, LRT/CTRS   Lailani Tool L 02/21/2016 3:08 PM 
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
## 2016-02-21 NOTE — [facility] Suicide Risk Assessment (Signed)
[facility]   Nursing information obtained from:   chart review, treatment team Demographic factors:   male, substance use Current Mental Status:   depression Loss Factors:   n/a Historical Factors:   suicide attempt, impulsivity,  Risk Reduction Factors:   employed, seeking for help, support system, future oriented, sense of responsibility to his three children  Total Time spent with patient: 30 minutes Principal Problem: Polysubstance dependence including opioid drug with daily use (HCC) Diagnosis:   Patient Active Problem List   Diagnosis Date Noted  . Polysubstance dependence including opioid drug with daily use [facility]) [F19.20] 02/21/2016   Subjective Data:  He states that he was advised by his fiance to come to the hospital. He states that he went to the clinic last week to be off from drugs (cocaine, weed, Xanax, Vicodine), but he relapsed again a couple of days ago and had an argument with his brother. He endorse depression. It has been difficult for him to concentrate. He has SI with plan to hang himself, cut his wrist or get shot by the police. He denies any intention while in the unit and contracts for safety. He has been irritable before coming here, and had vague HI towards non specific people, although he denies any intent. He denies current HI.  He occasionally has AH of his grandmother but denies any CAH. He denies VH.   He reports previous suicide attempt of hanging himself in 2011. He denies any family member attempted suicide.   Continued Clinical Symptoms:    The "Alcohol Use Disorders Identification Test", Guidelines for Use in Primary Care, Second Edition.  World Science writerHealth Organization [facility](WHO). Score between 0-7:  no or low risk or alcohol related problems. Score between 8-15:  moderate risk of alcohol related problems. Score between 16-19:  high risk of alcohol related problems. Score 20 or above:  warrants further diagnostic evaluation for  alcohol dependence and treatment.   CLINICAL FACTORS:   Depression:   Anhedonia Comorbid alcohol abuse/dependence Hopelessness Impulsivity Alcohol/Substance Abuse/Dependencies   Musculoskeletal: Strength & Muscle Tone: within normal limits Gait & Station: normal Patient leans: N/A  Psychiatric Specialty Exam: Physical Exam  Constitutional: He is oriented to person, place, and time. He appears well-developed and well-nourished.  Neurological: He is oriented to person, place, and time.  Drowsy. No tremors    Review of Systems  Constitutional: Positive for malaise/fatigue.  Neurological: Positive for dizziness.  Psychiatric/Behavioral: Positive for depression, hallucinations, substance abuse and suicidal ideas. The patient is nervous/anxious and has insomnia.     There were no vitals taken for this visit.There is no height or weight on file to calculate BMI.  General Appearance: Fairly Groomed  Eye Contact:  Poor  Speech:  Slow  Volume:  Decreased  Mood:  Depressed  Affect:  Restricted  Thought Process:  Coherent and Goal Directed  Orientation:  Full (Time, Place, and Person)  Thought Content:  Logical reports occasional AH of his grandmother, denies CAH, denies VH  Suicidal Thoughts:  Yes.  without intent/plan (positive for plans as above but no intent)  Homicidal Thoughts:  No  Memory:  impaired  Judgement:  Impaired  Insight:  Present  Psychomotor Activity:  Decreased  Concentration:  Concentration: Poor and Attention Span: Poor  Recall:  Poor  Fund of Knowledge:  Fair  Language:  Fair  Akathisia:  No  Handed:  Right  AIMS (if indicated):     Assets:  Desire for Improvement  ADL's:  Intact  Cognition:  WNL  Sleep:         COGNITIVE FEATURES THAT CONTRIBUTE TO RISK:  None    SUICIDE RISK:   Moderate:  Frequent suicidal ideation with limited intensity, and duration, some specificity in terms of plans, no associated intent, good self-control, limited  dysphoria/symptomatology, some risk factors present, and identifiable protective factors, including available and accessible social support.   PLAN OF CARE: Patient will be admitted to inpatient psychiatric unit for stabilization and safety. Will provide and encourage milieu participation. Provide medication management and make adjustments as needed.  Will follow daily.   I certify that inpatient services furnished can reasonably be expected to improve the patient's condition.  Neysa Hottereina Levonne Carreras, MD 02/21/2016, 12:30 PM

## 2016-02-21 NOTE — ED Notes (Signed)
[facility] and will be transported after 9am; Pt given ice pack for back pain

## 2016-02-21 NOTE — ED Notes (Signed)
Notified [facility] for transportation to [facility] 

## 2016-02-21 NOTE — [facility] Group Notes (Signed)
[facility] LCSW Group Therapy 02/21/2016  1:15 PM   Type of Therapy: Group Therapy  Participation Level: Did Not Attend. Patient invited to participate but declined.   Wilfred Dayrit, MSW, LCSW Clinical Social Worker [facility] [phone]   

## 2016-02-21 NOTE — ED Notes (Signed)
Breakfast tray delivered to pt.

## 2016-02-21 NOTE — ED Notes (Signed)
Consent for transfer obtained.

## 2016-02-22 MED ORDER — QUETIAPINE FUMARATE 50 MG PO TABS
50.0000 mg | ORAL_TABLET | Freq: Four times a day (QID) | ORAL | Status: DC | PRN
Start: 2016-02-22 — End: 2016-02-26
  Administered 2016-02-22: 50 mg via ORAL
  Filled 2016-02-22: qty 1

## 2016-02-22 MED ORDER — QUETIAPINE FUMARATE 50 MG PO TABS
50.0000 mg | ORAL_TABLET | Freq: Every day | ORAL | Status: DC
  Administered 2016-02-22: 50 mg via ORAL
  Filled 2016-02-22 (×4): qty 1

## 2016-02-22 MED ORDER — PNEUMOCOCCAL VAC POLYVALENT 25 MCG/0.5ML IJ INJ: 0.5000 mL | INJECTION | INTRAMUSCULAR | Status: DC

## 2016-02-22 MED ORDER — SERTRALINE HCL 25 MG PO TABS
25.0000 mg | ORAL_TABLET | Freq: Every day | ORAL | Status: DC
Start: 2016-02-22 — End: 2016-02-23
  Administered 2016-02-22 – 2016-02-23 (×2): 25 mg via ORAL
  Filled 2016-02-22 (×5): qty 1

## 2016-02-22 MED ORDER — ENSURE ENLIVE PO LIQD: 237.0000 mL | Freq: Two times a day (BID) | ORAL | Status: DC | PRN

## 2016-02-22 MED ORDER — QUETIAPINE FUMARATE 25 MG PO TABS
25.0000 mg | ORAL_TABLET | Freq: Every day | ORAL | Status: DC
  Filled 2016-02-22 (×3): qty 1
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
## 2016-02-22 NOTE — Progress Notes (Signed)
Recreation Therapy Notes  Date: 02/22/16 Time: 0930 Location: 300 Hall Group Room  Group Topic: Stress Management  Goal Area(s) Addresses:  Patient will verbalize importance of using healthy stress management.  Patient will identify positive emotions associated with healthy stress management.   Intervention: Stress Management   Activity :  Progressive Muscle Relaxation.  LRT introduced the technique of progressive muscle relaxation to patients.  Patients were to follow along with LRT as script was read to participate in activity.  Education:  Stress Management, Discharge Planning.   Education Outcome: Needs additional education  Clinical Observations/Feedback: Pt did not attend group.   Jeanny Rymer, LRT/CTRS         Azhar Knope A 02/22/2016 3:08 PM 

## 2016-02-22 NOTE — Progress Notes (Signed)
MD updated of patient's behavior last night and contraband found during room search.  Received order to restrict visitors. All floor staff notified.  Front Information systems managerdesk staff member contacted and notified of visitor restriction.

## 2016-02-22 NOTE — [facility] Group Notes (Signed)
[facility] LCSW Group Therapy 02/22/2016  1:15 PM   Type of Therapy: Group Therapy  Participation Level: Did Not Attend. Patient invited to participate but declined.   Mililani Murthy, MSW, LCSW Clinical Social Worker [facility] [phone]   

## 2016-02-22 NOTE — [facility] Group Notes (Signed)
Patient did not attend group. Patient took more snacks than he was supposed to and put them in his pocket. Nurse was notifed.
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
## 2016-02-22 NOTE — Progress Notes (Signed)
1:1 note 1400 Patient woke up at about 1030, vital signs complete and noted in flowsheet. Given AM medications late (OK per MD).  C/O back pain/tightness, anxiety.  Slow moving, steady gait. Depressed affect, depressed mood.  Patient returned to bed shortly after and observed resting quietly in bed with eyes closed, even non-labored chest rises.  Woke up around 1345 to speak with MD.  VS taken an noted in flowsheet.   Denies current SI, HI, AVH.  Minimal interaction, guarded, not attending groups.  Patient walked away from nurse when nurse attempted to speak with him.

## 2016-02-22 NOTE — [facility] Counselor (Signed)
CSW attempted to meet with patient but patient was asleep. CSW will attempt to complete assessment at a later time.  Chad BruinKristin Brad Lieurance, LCSW Clinical Social Worker [facility] [phone]

## 2016-02-22 NOTE — Progress Notes (Signed)
1:1 note  D: Pt presents with depressed, anxious affect and mood.  He is guarded, paranoid, and has some thought blocking as he is slow to respond to questions asked by Clinical research associatewriter.  Pt has his head down while speaking with Clinical research associatewriter.  Pt denies SI/HI at this time.  He reports having visual hallucinations "when I woke up."  Pt reports he saw "a person."  Pt attended evening group tonight.  He has been visible in the milieu.  Pt reports back pain of 7/10.    A: Introduced self to pt.  Encouraged pt to inform staff of needs and concerns.  1:1 staff is with pt for safety.  Medication administered per order.  PRN medication administered for pain and anxiety.  R: Pt is compliant with medications.  He is safe on the unit.  Pt remains on 1:1 precautions for safety.  Will continue to monitor and assess.

## 2016-02-22 NOTE — Progress Notes (Signed)
Patient ID: Chad Rojas, male   DOB: 10/18/1990, 25 y.o.   MRN: [id number]  1:1 Note:   Pt lying in bed, eyes asleep and snoring. Respirations regular and unlabored.   Sitter at bedside, offered break.   Pt remains safe on a 1:1. Will continue to monitor.
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
## 2016-02-22 NOTE — Progress Notes (Signed)
NUTRITION ASSESSMENT  Pt identified as at risk on the Malnutrition Screen Tool  INTERVENTION: 1. Supplements: Ensure Enlive po PRN, each supplement provides 350 kcal and 20 grams of protein  NUTRITION DIAGNOSIS: Unintentional weight loss related to sub-optimal intake as evidenced by pt report.   Goal: Pt to meet >/= 90% of their estimated nutrition needs.  Monitor:  PO intake  Assessment:  Pt admitted with polysubstance abuse. Pt with history of marijuana, cocaine and prescription drug use. He reports drinking 1/5th of liquor weekly. Pt reports 20 lb weight loss. Per weight history, pt has lost 22 lb since 06/10/15 (10% wt loss x 8 months, insignificant for time frame). RD will order Ensure supplements PRN.   Height: Ht Readings from Last 1 Encounters:  02/21/16 5\' 11"  (1.803 m)    Weight: Wt Readings from Last 1 Encounters:  02/21/16 198 lb (89.8 kg)    Weight Hx: Wt Readings from Last 10 Encounters:  02/21/16 198 lb (89.8 kg)  02/20/16 206 lb (93.4 kg)  06/10/15 220 lb (99.8 kg)    BMI:  Body mass index is 27.62 kg/m. Pt meets criteria for overweight based on current BMI.  Estimated Nutritional Needs: Kcal: 25-30 kcal/kg Protein: > 1 gram protein/kg Fluid: 1 ml/kcal  Diet Order: Diet regular Room service appropriate? Yes; Fluid consistency: Thin Pt is also offered choice of unit snacks mid-morning and mid-afternoon.  Pt is eating as desired.   Lab results and medications reviewed.   Tilda FrancoLindsey Shaily Librizzi, MS, RD, LDN Pager: [phone] After Hours Pager: [phone]

## 2016-02-22 NOTE — Progress Notes (Signed)
Patient ID: Jeryl ColumbiaDonald C Laurel, male   DOB: 12/30/1990, 25 y.o.   MRN: [id number]  1:1 Note:  Pt lying in bed, on left side with eyes closed. Breathing regular and unlabored.   Sitter at bedside. Discussed unit restriction order and 300 hall MHT to bring patient a breakfast tray back to room.   Pt remains safe on a 1:1. Will continue to monitor.

## 2016-02-22 NOTE — Tx Team (Signed)
Interdisciplinary Treatment Plan Update (Adult) Date: 02/22/2016    Time Reviewed: 9:30 AM  Progress in Treatment: Attending groups: Continuing to assess, patient new to milieu Participating in groups: Continuing to assess, patient new to milieu Taking medication as prescribed: Yes Tolerating medication: Yes Family/Significant other contact made: No, CSW assessing for appropriate contacts Patient understands diagnosis: Yes Discussing patient identified problems/goals with staff: Yes Medical problems stabilized or resolved: Yes Denies suicidal/homicidal ideation: Yes Issues/concerns per patient self-inventory: Yes Other:  New problem(s) identified: N/A  Discharge Plan or Barriers: CSW continuing to assess, patient new to milieu.  Reason for Continuation of Hospitalization:  Depression Anxiety Medication Stabilization   Comments: N/A  Estimated length of stay: 3-5 days   Review of initial/current patient goals per problem list:  1. Goal(s): Patient will participate in aftercare plan   Met: No   Target date: 3-5 days post admission date   As evidenced by: Patient will participate within aftercare plan AEB aftercare provider and housing plan at discharge being identified.  8/16: Goal not met: CSW assessing for appropriate referrals for pt and will have follow up secured prior to d/c.    2. Goal (s): Patient will exhibit decreased depressive symptoms and suicidal ideations.   Met: No   Target date: 3-5 days post admission date   As evidenced by: Patient will utilize self rating of depression at 3 or below and demonstrate decreased signs of depression or be deemed stable for discharge by MD.  8/16: Goal progressing. Patient reports some improvements in his symptoms.   4. Goal(s): Patient will demonstrate decreased signs of withdrawal due to substance abuse   Met: Yes   Target date: 3-5 days post admission date   As evidenced by: Patient will produce a  CIWA/COWS score of 0, have stable vitals signs, and no symptoms of withdrawal  8/16: Goal met. No withdrawal symptoms reported at this time per medical chart.    Attendees: Patient:    Family:    Physician: Dr. Modesta Messing, Dr. Ananias Pilgrim 02/22/2016 9:30 AM  Nursing:  Otilio Carpen, Desma Paganini, RN 02/22/2016 9:30 AM  Clinical Social Worker: Erasmo Downer Dilana Mcphie, LCSW 02/22/2016 9:30 AM  Other: Peri Maris, LCSW  02/22/2016 9:30 AM  Other: 02/22/2016 9:30 AM  Other: Lars Pinks, Case Manager 02/22/2016 9:30 AM  Other:  Larose Kells, NP 02/22/2016 9:30 AM  Other:    Other:      Scribe for Treatment Team:  Tilden Fossa, Neche

## 2016-02-22 NOTE — Progress Notes (Signed)
Late entry 1000-  Patient in bed with eyes closed, even non-labored chest rises.  1:1 staff member within close proximity.   Not seen up out of bed this shift yet.

## 2016-02-22 NOTE — Progress Notes (Signed)
Adult Psychoeducational Group Note  Date:  02/22/2016 Time:  7:11 PM  Group Topic/Focus:  Orientation:   The focus of this group is to educate the patient on the purpose and policies of crisis stabilization and provide a format to answer questions about their admission.  The group details unit policies and expectations of patients while admitted.   Participation Level:  Did Not Attend  Participation Quality:  Did not attend  Additional Comments:  Did not attend  Karleen HampshireFox, Tavien Chestnut Brittini 02/22/2016, 7:11 PM

## 2016-02-23 LAB — PROLACTIN: PROLACTIN: 11.1 ng/mL (ref 4.0–15.2)

## 2016-02-23 LAB — HEMOGLOBIN A1C
Hgb A1c MFr Bld: 5.4 % (ref 4.8–5.6)
Mean Plasma Glucose: 108 mg/dL

## 2016-02-23 MED ORDER — QUETIAPINE FUMARATE 25 MG PO TABS
25.0000 mg | ORAL_TABLET | Freq: Two times a day (BID) | ORAL | Status: DC
Start: 2016-02-23 — End: 2016-02-24
  Administered 2016-02-23 – 2016-02-24 (×2): 25 mg via ORAL
  Filled 2016-02-23 (×7): qty 1

## 2016-02-23 MED ORDER — QUETIAPINE FUMARATE 100 MG PO TABS
100.0000 mg | ORAL_TABLET | Freq: Every day | ORAL | Status: DC
  Administered 2016-02-23 – 2016-02-25 (×3): 100 mg via ORAL
  Filled 2016-02-23 (×6): qty 1

## 2016-02-23 MED ORDER — SERTRALINE HCL 50 MG PO TABS
50.0000 mg | ORAL_TABLET | Freq: Every day | ORAL | Status: DC
  Administered 2016-02-24 – 2016-02-26 (×3): 50 mg via ORAL
  Filled 2016-02-23 (×5): qty 1

## 2016-02-23 NOTE — Progress Notes (Signed)
1:1 Note: Patient maintained on constant supervision for safety.  Patient appears irritable, anxious and depressed.  Report withdrawal symptoms of anxiety, agitation, aches, irritability, and runny nose.  Denies auditory and visual hallucinations.  Medications given as prescribed.  Minimal interaction with staff.  Attended group and participated.  Vistaril 25 mg and Robaxin given with good effect.  Routine safety checks continues.

## 2016-02-23 NOTE — [facility] Group Notes (Signed)
[facility] Group Notes:  (Nursing/MHT/Case Management/Adjunct)  Date:  02/23/2016  Time:  11:27 AM  Type of Therapy:  Nurse Education  Participation Level:  Active  Participation Quality:  Appropriate and Attentive  Affect:  Appropriate  Cognitive:  Alert and Appropriate  Insight:  Appropriate and Good  Engagement in Group:  Engaged  Modes of Intervention:  Discussion and Education  Summary of Progress/Problems: Topic was on stress management. Discussed the importance of choosing a positive coping skills and healthy leisure activities. Group encouraged to surround themselves with positive and healthy group/support system when changing to a healthy lifestyle. Patient states remembering the happy places help him deal with stress.  Patient was receptive and contributed. Patient states goal is communicating and meeting people.    Mickie Baillizabeth O Iwenekha 02/23/2016, 11:27 AM

## 2016-02-23 NOTE — Plan of Care (Signed)
Problem: Medication: Goal: Compliance with prescribed medication regimen will improve Outcome: Progressing Pt has been compliant with scheduled medications tonight.    

## 2016-02-23 NOTE — Progress Notes (Signed)
The focus of this group is to help patients review their daily goal of treatment and discuss progress on daily workbooks.  Patient attended group and participated. Patient states that he wanted to work on communicating with others. Patient states that his day ws a 9/10

## 2016-02-23 NOTE — [facility] Group Notes (Signed)
[facility] LCSW Group Therapy 02/23/2016  1:15 pm  Type of Therapy: Group Therapy Participation Level: Minimal  Participation Quality: Attentive  Affect: Blunted  Cognitive: Alert and Oriented  Insight: Developing/Improving and Engaged  Engagement in Therapy: Developing/Improving and Engaged  Modes of Intervention: Clarification, Confrontation, Discussion, Education, Exploration,  Limit-setting, Orientation, Problem-solving, Rapport Building, Dance movement psychotherapisteality Testing, Socialization and Support  Summary of Progress/Problems: Pt identified obstacles faced currently and processed barriers involved in overcoming these obstacles. Pt identified steps necessary for overcoming these obstacles and explored motivation (internal and external) for facing these difficulties head on. Pt further identified one area of concern in their lives and chose a goal to focus on for today. Patient did not engage in discussion despite CSW encouragement.   Samuella BruinKristin Reuben Knoblock, LCSW Clinical Social Worker [facility] [phone]

## 2016-02-23 NOTE — Progress Notes (Addendum)
Patient ID: Chad Rojas, male   DOB: 07/05/1991, 25 y.o.   MRN: [id number]  [facility] MD Progress Note  02/23/2016 10:14 AM Chad Rojas  MRN:  [id number]  Subjective:   Patient seen, chart reviewed and case discussed with nursing staff.  - No significant behavioral issues since last night per report.   He states that he feels better today; he is "more willing to communicate" with other peers in the unit. He is motivated for sobriety and states that he came here himself to show his children that he is getting treatment. He is going to attend groups and is willing to take any medication which helps him. He adamantly denies any SI, although he continues to feel depressed. He had insomnia last night. He has AH of his grandmother; denies CAH. He has VH of shadows this morning. He states he hide suboxone in his head and denies that his girlfriend brought it here. He again is motivated for sobriety and reports he would not use any substances.   Principal Problem: Polysubstance dependence including opioid drug with daily use (HCC) Diagnosis:   Patient Active Problem List   Diagnosis Date Noted  . Polysubstance dependence including opioid drug with daily use [facility](HCC) [F19.20] 02/21/2016   Total Time spent with patient: 20 minutes  Past Psychiatric History: He has seen a psychiatrist sometime in the past, but does not remember medication. Last psychiatry admission was in 2011, when he tried to hang himself. He denies decreased need for sleep, euphoria or family history of bipolar disorder/suicide attempt  Past Medical History: History reviewed. No pertinent past medical history. History reviewed. No pertinent surgical history. Family History: History reviewed. No pertinent family history. Family Psychiatric  History: denies Social History:  History  Alcohol Use  . Yes    Comment: last use last week/drinks approx 1/5th liquor a week     History  Drug Use  . Types: Cocaine, Marijuana     Social History   Social History  . Marital status: Single    Spouse name: N/A  . Number of children: N/A  . Years of education: N/A   Social History Main Topics  . Smoking status: Current Some Day Smoker    Packs/day: 0.25    Years: 0.50    Types: Cigarettes  . Smokeless tobacco: Never Used     Comment: refuses patch/smoked 3 pks in 3 months trying to quit marijuana  . Alcohol use Yes     Comment: last use last week/drinks approx 1/5th liquor a week  . Drug use:     Types: Cocaine, Marijuana  . Sexual activity: Yes    Birth control/ protection: None   Other Topics Concern  . None   Social History Narrative  . None   Additional Social History:                         Sleep: Fair  Appetite:  Fair  Current Medications: Current Facility-Administered Medications  Medication Dose Route Frequency Provider Last Rate Last Dose  . acetaminophen (TYLENOL) tablet 650 mg  650 mg Oral Q6H PRN Sanjuana KavaAgnes I Nwoko, NP   650 mg at 02/22/16 2136  . alum & mag hydroxide-simeth (MAALOX/MYLANTA) 200-200-20 MG/5ML suspension 30 mL  30 mL Oral Q4H PRN Sanjuana KavaAgnes I Nwoko, NP      . cloNIDine (CATAPRES) tablet 0.1 mg  0.1 mg Oral QID Sanjuana KavaAgnes I Nwoko, NP   0.1 mg at 02/23/16 [phone]  Followed by  . cloNIDine (CATAPRES) tablet 0.1 mg  0.1 mg Oral BH-qamhs Sanjuana Kava, NP       Followed by  . [START ON 02/26/2016] cloNIDine (CATAPRES) tablet 0.1 mg  0.1 mg Oral QAC breakfast Sanjuana Kava, NP      . dicyclomine (BENTYL) tablet 20 mg  20 mg Oral Q6H PRN Sanjuana Kava, NP      . feeding supplement (ENSURE ENLIVE) (ENSURE ENLIVE) liquid 237 mL  237 mL Oral BID PRN Craige Cotta, MD      . hydrOXYzine (ATARAX/VISTARIL) tablet 25 mg  25 mg Oral Q6H PRN Sanjuana Kava, NP   25 mg at 02/23/16 0801  . ketorolac (TORADOL) injection 30 mg  30 mg Intramuscular Once [employer], PA-C      . loperamide (IMODIUM) capsule 2-4 mg  2-4 mg Oral PRN Sanjuana Kava, NP      . LORazepam (ATIVAN) tablet 1 mg  1  mg Oral Q6H PRN Sanjuana Kava, NP      . magnesium hydroxide (MILK OF MAGNESIA) suspension 30 mL  30 mL Oral Daily PRN Sanjuana Kava, NP      . methocarbamol (ROBAXIN) tablet 500 mg  500 mg Oral Q8H PRN Sanjuana Kava, NP   500 mg at 02/23/16 0804  . multivitamin with minerals tablet 1 tablet  1 tablet Oral Daily Sanjuana Kava, NP   1 tablet at 02/23/16 0804  . naproxen (NAPROSYN) tablet 500 mg  500 mg Oral BID PRN Kerry Hough, PA-C   500 mg at 02/22/16 2258  . ondansetron (ZOFRAN-ODT) disintegrating tablet 4 mg  4 mg Oral Q6H PRN Sanjuana Kava, NP      . pneumococcal 23 valent vaccine (PNU-IMMUNE) injection 0.5 mL  0.5 mL Intramuscular Tomorrow-1000 Rockey Situ Cobos, MD      . QUEtiapine (SEROQUEL) tablet 50 mg  50 mg Oral Q6H PRN Neysa Hotter, MD   50 mg at 02/22/16 1556  . QUEtiapine (SEROQUEL) tablet 50 mg  50 mg Oral QHS Neysa Hotter, MD   50 mg at 02/22/16 2135  . sertraline (ZOLOFT) tablet 25 mg  25 mg Oral Daily Neysa Hotter, MD   25 mg at 02/23/16 0801  . thiamine (VITAMIN B-1) tablet 100 mg  100 mg Oral Daily Sanjuana Kava, NP   100 mg at 02/23/16 6962    Lab Results:  Results for orders placed or performed during the hospital encounter of 02/21/16 (from the past 48 hour(s))  Hemoglobin A1c     Status: None   Collection Time: 02/22/16  6:21 PM  Result Value Ref Range   Hgb A1c MFr Bld 5.4 4.8 - 5.6 %    Comment: (NOTE)         Pre-diabetes: 5.7 - 6.4         Diabetes: >6.4         Glycemic control for adults with diabetes: <7.0    Mean Plasma Glucose 108 mg/dL    Comment: (NOTE) Performed At: [facility] [address] [place], [place] [id number] Mila Homer MD [id number] Performed at [facility]   Prolactin     Status: None   Collection Time: 02/22/16  6:21 PM  Result Value Ref Range   Prolactin 11.1 4.0 - 15.2 ng/mL    Comment: (NOTE) Performed At: [facility] [address] [place], [place] [id number] Mila Homer  MD [id number] [facility]formed  at [facility]     Blood Alcohol level:  Lab Results  Component Value Date   [facility] 02/20/2016    Metabolic Disorder Labs: Lab Results  Component Value Date   HGBA1C 5.4 02/22/2016   MPG 108 02/22/2016   MPG 122 10/04/2007   Lab Results  Component Value Date   PROLACTIN 11.1 02/22/2016   Lab Results  Component Value Date   CHOL  10/04/2007    148        ATP III CLASSIFICATION:  <200     mg/dL   Desirable  [id number]  mg/dL   Borderline High  >=045>=240    mg/dL   High   TRIG 409127 81/19/147803/28/2009   HDL 28 (L) 10/04/2007   CHOLHDL 5.3 10/04/2007   VLDL 25 10/04/2007   LDLCALC  10/04/2007    95        Total Cholesterol/HDL:CHD Risk Coronary Heart Disease Risk Table                     Men   Women  1/2 Average Risk   3.4   3.3    Physical Findings: AIMS: Facial and Oral Movements Muscles of Facial Expression: None, normal Lips and Perioral Area: None, normal Jaw: None, normal Tongue: None, normal,Extremity Movements Upper (arms, wrists, hands, fingers): None, normal Lower (legs, knees, ankles, toes): None, normal, Trunk Movements Neck, shoulders, hips: None, normal, Overall Severity Severity of abnormal movements (highest score from questions above): None, normal Incapacitation due to abnormal movements: None, normal Patient's awareness of abnormal movements (rate only patient's report): No Awareness, Dental Status Current problems with teeth and/or dentures?: No Does patient usually wear dentures?: No  CIWA:  CIWA-Ar Total: 0 COWS:  COWS Total Score: 5  Musculoskeletal: Strength & Muscle Tone: within normal limits Gait & Station: normal Patient leans: N/A  Psychiatric Specialty Exam: Physical Exam  Constitutional: He appears well-developed and well-nourished.  Neurological:  Drowsy, no tremors    Review of Systems  Neurological: Positive for dizziness.  Psychiatric/Behavioral: Positive for depression and  hallucinations. The patient is nervous/anxious and has insomnia.   All other systems reviewed and are negative.   Blood pressure 137/81, pulse 68, temperature 97.7 F (36.5 C), temperature source Oral, resp. rate 14, height 5\' 11"  (1.803 m), weight 198 lb (89.8 kg), SpO2 99 %.Body mass index is 27.62 kg/m.  General Appearance: Casual  Eye Contact:  Fair  Speech:  Normal Rate  Volume:  Normal  Mood:  Depressed and better  Affect:  Restricted, improving  Thought Process:  Coherent Perceptions: AH of his grandmother, VH of shadows  Orientation:  Full (Time, Place, and Person)  Thought Content:  Logical  Suicidal Thoughts:  No  Homicidal Thoughts:  No  Memory:  good   Judgement:  Fair  Insight:  Present  Psychomotor Activity:  Normal  Concentration:  Concentration: Good and Attention Span: Good  Recall:  Good  Fund of Knowledge:  Fair  Language:  Fair  Akathisia:  No  Handed:  Right  AIMS (if indicated):     Assets:  Communication Skills  ADL's:  Intact  Cognition:  WNL  Sleep:  Number of Hours: 6   Assessment 25 year old male with polysubstance use, presented with SI in the context of cocaine and marijuana use. After admission, patient was found to have half of suboxone strip in patients toilet paper roll (patient reports he hid it in his hair upon admission). Later  patient was found to have a sheet around his neck tied onto the doorknob at the hallway at the night of admission.   # MDD # r/o substance induced mood disorder Patient reports chronic SI for years before this admission. Although it is difficult to discern from substance induced mood disorder, have started sertraline to target his mood given severity. There is marked improvement in his mood and behavior; will further uptitrate sertraline to optimize the effect. Will increase quetiapine to target his insomnia, and also for his perceptual disturbances/anxiety. Will discontinue 1:1 given improvement as described above; he  contracts for safety. Will continue to restrict visitors at this time.   # Cocaine use disorder # Marijuana use disorder Continue on clonidine protocol. Patient is motivated for sobriety. Continue motivational interview.  Plans - Increase sertraline 50 mg daily - Increase Quetiapine 100 mg qhs - Start Quetiapine 25 mg BID - Quetiapine 50 mg q6hprn for agitation, irritability, insomnia - Discontinue 1:1 - Continue opiate withdrawal protocol with clonidine - Medication management to reduce current symptoms to base line and improve the patient's overall level of functioning. - Monitor for the adverse effect of the medications and anger outbursts - Continue 15 minutes observation for safety concerns - Encouraged to participate in milieu therapy and group therapy counseling sessions and also work with coping skills - Continue to develop treatment plan to decrease risk of relapse upon discharge and to reduce the need for readmission. -  Psycho-social education regarding relapse prevention and self care. - Health care follow up as needed for medical problems. - Restart home medications where appropriate.  Treatment Plan Summary: Daily contact with patient to assess and evaluate symptoms and progress in treatment  Neysa Hotter, MD 02/23/2016, 10:14 AM

## 2016-02-23 NOTE — Progress Notes (Signed)
[facility] Post 1:1 Observation Documentation  For the first (8) hours following discontinuation of 1:1 precautions, a progress note entry by nursing staff should be documented at least every 2 hours, reflecting the patient's behavior, condition, mood, and conversation.  Use the progress notes for additional entries.  Time 1:1 discontinued:  10:54am  [name]:  Patient is mood and affect is anxious.  [name]:  Patient is calm and cooperative.  Patient is in the dayroom sitting quietly.    [name]:  Minimal interaction with peers and staff.    Mickie Baillizabeth O Iwenekha 02/23/2016, 3:32 PM

## 2016-02-23 NOTE — Progress Notes (Signed)
1:1 note  Pt is currently resting in bed with eyes closed.  Respirations are even and unlabored. Pt appears to be asleep.  No distress noted.  1:1 staff remains with pt per order for safety  Pt is safe.  Will continue to monitor and assess. 

## 2016-02-23 NOTE — Progress Notes (Signed)
1:1 order d/c'd per MD. Lanora ManisElizabeth, RN., made aware. Simonne ComeLeo, MHT., made aware and Johnisha, MHT., notified.

## 2016-02-23 NOTE — Progress Notes (Addendum)
1:1 note  Pt is currently resting in bed with eyes closed.  Respirations are even and unlabored. Pt appears to be asleep.  No distress noted.  1:1 staff remains with pt per order for safety  Pt is safe.  Will continue to monitor and assess.

## 2016-02-23 NOTE — Progress Notes (Signed)
Patient ID: Chad Rojas, male   DOB: 01/06/1991, 25 y.o.   MRN: [id number]  Writer was outside with an assignment with another patient at therapeutic recreation time. Writer witnessed Dorinda HillDonald playing football with other male peers. The football was thrown by a male peer and patient ran for the football and dove intentionally therefore descended to the ground. Patient promptly got up and appeared in no distress. RN Lanora ManisElizabeth I. was notified of this as well as NP EthiopiaAgustin.

## 2016-02-23 NOTE — Progress Notes (Signed)
[facility] Post 1:1 Observation Documentation  For the first (8) hours following discontinuation of 1:1 precautions, a progress note entry by nursing staff should be documented at least every 2 hours, reflecting the patient's behavior, condition, mood, and conversation.  Use the progress notes for additional entries.  Time 1:1 discontinued:  10:54AM  [name]:  Patient is calm and pleasant.  Mood and affect is bright.  [name]:  Patient is alert and oriented x 4.  No complain reported or noted.  [name]:  Patient interacting with staff and peers in the dayroom.  Chad Rojas 02/23/2016, 7:02 PM

## 2016-02-23 NOTE — Progress Notes (Signed)
[facility] Post 1:1 Observation Documentation  For the first (8) hours following discontinuation of 1:1 precautions, a progress note entry by nursing staff should be documented at least every 2 hours, reflecting the patient's behavior, condition, mood, and conversation.  Use the progress notes for additional entries.  Time 1:1 discontinued:  10:54am  [name]:  Patient is calm and appropriate on the unit.  [name]:  Patient is alert and oriented x 4.  No complain reported or noted.  [name]:  Minimal interaction with staff and peers.  Chad Rojas 02/23/2016, 3:44 PM

## 2016-02-23 NOTE — Progress Notes (Signed)
[facility] Post 1:1 Observation Documentation  For the first (8) hours following discontinuation of 1:1 precautions, a progress note entry by nursing staff should be documented at least every 2 hours, reflecting the patient's behavior, condition, mood, and conversation.  Use the progress notes for additional entries.  Time 1:1 discontinued:  10:54AM  [name]:  Patient is calm and cooperative.  Patient in the dayroom socializing with peers.  [name]:  Patient is alert and oriented x 4.    [name]:  Minimal interaction with staff.  Patient voice no concern or distress.  Mickie Baillizabeth O Iwenekha 02/23/2016, 6:07 PM

## 2016-02-23 NOTE — Progress Notes (Signed)
Patient ID: Chad Rojas, male   DOB: 10/28/1990, 25 y.o.   MRN: [id number] D: Client is pleasant, reports "day been good" visible on the unit, tonight. Client reports he has two children and raising his step son. Reports toss and turned last night. A: Writer provided emotional support. Medications reviewed and  administered as ordered, noted that Seroquel had been increased from 50 mg to 100 mg. Staff will monitor q6315min for safety. R: Client is safe on the unit, attended group.

## 2016-02-24 MED ORDER — QUETIAPINE FUMARATE 50 MG PO TABS
50.0000 mg | ORAL_TABLET | Freq: Two times a day (BID) | ORAL | Status: DC
  Administered 2016-02-24 – 2016-02-26 (×4): 50 mg via ORAL
  Filled 2016-02-24 (×10): qty 1

## 2016-02-24 NOTE — Progress Notes (Addendum)
Recreation Therapy Notes  Date: 02/24/16 Time: 0930 Location: 300 Hall Group Room  Group Topic: Stress Management  Goal Area(s) Addresses:  Patient will verbalize importance of using healthy stress management.  Patient will identify positive emotions associated with healthy stress management.   Intervention: Stress Management  Activity :  [employer] Guided Imagery.  LRT introduced patients to the technique of guided imagery.  Patients were to follow along as LRT read script to participate in stress management technique.     Education:  Stress Management, Discharge Planning.   Education Outcome:  Needs additional education  Clinical Observations/Feedback:  Pt did not attend group.    Caroll RancherMarjette Laqueshia Cihlar, LRT/CTRS    Caroll RancherLindsay, Treyshaun Keatts A 02/24/2016 12:44 PM

## 2016-02-24 NOTE — [facility] Group Notes (Signed)
[facility] LCSW Group Therapy 02/24/2016  1:30 pm   Type of Therapy: Group Therapy Participation Level: Minimal  Participation Quality: Attentive  Affect: Depressed and Flat  Cognitive: Alert and Oriented  Insight: Developing/Improving and Engaged  Engagement in Therapy: Developing/Improving and Engaged  Modes of Intervention: Clarification, Confrontation, Discussion, Education, Exploration, Limit-setting, Orientation, Problem-solving, Rapport Building, Dance movement psychotherapisteality Testing, Socialization and Support  Summary of Progress/Problems: The topic for group was balance in life. Today's group focused on defining balance in one's own words, identifying things that can knock one off balance, and exploring healthy ways to maintain balance in life. Group members were asked to provide an example of a time when they felt off balance, describe how they handled that situation,and process healthier ways to regain balance in the future. Group members were asked to share the most important tool for maintaining balance that [facility] and how they plan to apply this method after discharge. Patient entered group late and did not participate in discussion despite CSW encouragement.  Samuella BruinKristin Laporche Martelle, MSW, LCSW Clinical Social Worker [facility] [phone]

## 2016-02-24 NOTE — Plan of Care (Signed)
Problem: Medication: Goal: Compliance with prescribed medication regimen will improve Outcome: Progressing Client is compliant with medications AEB taking medications as ordered, denies side effects.

## 2016-02-24 NOTE — Progress Notes (Signed)
Patient ID: Chad Rojas, male   DOB: 08/23/1990, 25 y.o.   MRN: [id number]  [facility]  02/24/2016 1:18 PM Chad ColumbiaDonald C Seyler  MRN:  [id number]  Subjective:   Patient seen, chart reviewed and case discussed with nursing staff.  - No significant behavioral issues since last night per report.   He states he feels "not good." He had insomnia last night with night time awakening. He has heard some voice of lady to come with him. He sees shadow. He wants to isolate himself. He does not care about the restriction for visitors (tells in a calm way). He has occasional passive SI. He denies any intent/plans.  Principal Problem: Polysubstance dependence including opioid drug with daily use (HCC) Diagnosis:   Patient Active Problem List   Diagnosis Date Noted  . Polysubstance dependence including opioid drug with daily use [facility](HCC) [F19.20] 02/21/2016   Total Time spent with patient: 20 minutes  Past Psychiatric History: He has seen a psychiatrist sometime in the past, but does not remember medication. Last psychiatry admission was in 2011, when he tried to hang himself. He denies decreased need for sleep, euphoria or family history of bipolar disorder/suicide attempt  Past Medical History: History reviewed. No pertinent past medical history. History reviewed. No pertinent surgical history. Family History: History reviewed. No pertinent family history. Family Psychiatric  History: denies Social History:  History  Alcohol Use  . Yes    Comment: last use last week/drinks approx 1/5th liquor a week     History  Drug Use  . Types: Cocaine, Marijuana    Social History   Social History  . Marital status: Single    Spouse name: N/A  . Number of children: N/A  . Years of education: N/A   Social History Main Topics  . Smoking status: Current Some Day Smoker    Packs/day: 0.25    Years: 0.50    Types: Cigarettes  . Smokeless tobacco: Never Used     Comment: refuses  patch/smoked 3 pks in 3 months trying to quit marijuana  . Alcohol use Yes     Comment: last use last week/drinks approx 1/5th liquor a week  . Drug use:     Types: Cocaine, Marijuana  . Sexual activity: Yes    Birth control/ protection: None   Other Topics Concern  . None   Social History Narrative  . None   Additional Social History:                         Sleep: Fair  Appetite:  Fair  Current Medications: Current Facility-Administered Medications  Medication Dose Route Frequency Provider Last Rate Last Dose  . acetaminophen (TYLENOL) tablet 650 mg  650 mg Oral Q6H PRN Sanjuana KavaAgnes I Nwoko, NP   650 mg at 02/24/16 0811  . alum & mag hydroxide-simeth (MAALOX/MYLANTA) 200-200-20 MG/5ML suspension 30 mL  30 mL Oral Q4H PRN Sanjuana KavaAgnes I Nwoko, NP      . cloNIDine (CATAPRES) tablet 0.1 mg  0.1 mg Oral BH-qamhs Sanjuana KavaAgnes I Nwoko, NP   0.1 mg at 02/24/16 [id number]   Followed by  . [START ON 02/26/2016] cloNIDine (CATAPRES) tablet 0.1 mg  0.1 mg Oral QAC breakfast Sanjuana KavaAgnes I Nwoko, NP      . dicyclomine (BENTYL) tablet 20 mg  20 mg Oral Q6H PRN Sanjuana KavaAgnes I Nwoko, NP      . feeding supplement (ENSURE ENLIVE) (ENSURE ENLIVE) liquid 237 mL  237  mL Oral BID PRN Craige CottaFernando A Cobos, MD      . hydrOXYzine (ATARAX/VISTARIL) tablet 25 mg  25 mg Oral Q6H PRN Sanjuana KavaAgnes I Nwoko, NP   25 mg at 02/23/16 0801  . ketorolac (TORADOL) injection 30 mg  30 mg Intramuscular Once IntelSpencer E Simon, PA-C      . loperamide (IMODIUM) capsule 2-4 mg  2-4 mg Oral PRN Sanjuana KavaAgnes I Nwoko, NP      . magnesium hydroxide (MILK OF MAGNESIA) suspension 30 mL  30 mL Oral Daily PRN Sanjuana KavaAgnes I Nwoko, NP      . methocarbamol (ROBAXIN) tablet 500 mg  500 mg Oral Q8H PRN Sanjuana KavaAgnes I Nwoko, NP   500 mg at 02/23/16 0804  . multivitamin with minerals tablet 1 tablet  1 tablet Oral Daily Sanjuana KavaAgnes I Nwoko, NP   1 tablet at 02/24/16 [id number]  . naproxen (NAPROSYN) tablet 500 mg  500 mg Oral BID PRN Kerry HoughSpencer E Simon, PA-C   500 mg at 02/22/16 2258  . ondansetron (ZOFRAN-ODT)  disintegrating tablet 4 mg  4 mg Oral Q6H PRN Sanjuana KavaAgnes I Nwoko, NP      . pneumococcal 23 valent vaccine (PNU-IMMUNE) injection 0.5 mL  0.5 mL Intramuscular Tomorrow-1000 Rockey SituFernando A Cobos, MD      . QUEtiapine (SEROQUEL) tablet 100 mg  100 mg Oral QHS Neysa Hottereina Annaliza Zia, MD   100 mg at 02/23/16 2145  . QUEtiapine (SEROQUEL) tablet 50 mg  50 mg Oral Q6H PRN Neysa Hottereina Maddock Finigan, MD   50 mg at 02/22/16 1556  . QUEtiapine (SEROQUEL) tablet 50 mg  50 mg Oral BID Neysa Hottereina Imri Lor, MD      . sertraline (ZOLOFT) tablet 50 mg  50 mg Oral Daily Neysa Hottereina Atanacio Melnyk, MD   50 mg at 02/24/16 11910808  . thiamine (VITAMIN B-1) tablet 100 mg  100 mg Oral Daily Sanjuana KavaAgnes I Nwoko, NP   100 mg at 02/24/16 [id number]    Lab Results:  Results for orders placed or performed during the hospital encounter of 02/21/16 (from the past 48 hour(s))  Hemoglobin A1c     Status: None   Collection Time: 02/22/16  6:21 PM  Result Value Ref Range   Hgb A1c MFr Bld 5.4 4.8 - 5.6 %    Comment: (NOTE)         Pre-diabetes: 5.7 - 6.4         Diabetes: >6.4         Glycemic control for adults with diabetes: <7.0    Mean Plasma Glucose 108 mg/dL    Comment: (NOTE) Performed At: [facility] [address] [place], [place] [id number] Mila HomerHancock William F MD VH:[id number]:[phone] Performed at [facility]   Prolactin     Status: None   Collection Time: 02/22/16  6:21 PM  Result Value Ref Range   Prolactin 11.1 4.0 - 15.2 ng/mL    Comment: (NOTE) Performed At: [facility] [place] [address] [place], [place] [id number] Mila HomerHancock William F MD UV:[id number]:[phone] Performed at [facility]     Blood Alcohol level:  Lab Results  Component Value Date   [facility]5 02/20/2016    Metabolic Disorder Labs: Lab Results  Component Value Date   HGBA1C 5.4 02/22/2016   MPG 108 02/22/2016   MPG 122 10/04/2007   Lab Results  Component Value Date   PROLACTIN 11.1 02/22/2016   Lab Results  Component Value Date   CHOL  10/04/2007    148  ATP III CLASSIFICATION:  <200     mg/dL   Desirable  811-914  mg/dL   Borderline High  >=782    mg/dL   High   TRIG 956 21/30/8657   HDL 28 (L) 10/04/2007   CHOLHDL 5.3 10/04/2007   VLDL 25 10/04/2007   LDLCALC  10/04/2007    95        Total Cholesterol/HDL:CHD Risk Coronary Heart Disease Risk Table                     Men   Women  1/2 Average Risk   3.4   3.3    Physical Findings: AIMS: Facial and Oral Movements Muscles of Facial Expression: None, normal Lips and Perioral Area: None, normal Jaw: None, normal Tongue: None, normal,Extremity Movements Upper (arms, wrists, hands, fingers): None, normal Lower (legs, knees, ankles, toes): None, normal, Trunk Movements Neck, shoulders, hips: None, normal, Overall Severity Severity of abnormal movements (highest score from questions above): None, normal Incapacitation due to abnormal movements: None, normal Patient's awareness of abnormal movements (rate only patient's report): No Awareness, Dental Status Current problems with teeth and/or dentures?: No Does patient usually wear dentures?: No  CIWA:  CIWA-Ar Total: 1 COWS:  COWS Total Score: 3  Musculoskeletal: Strength & Muscle Tone: within normal limits Gait & Station: normal Patient leans: N/A  Psychiatric Specialty Exam: Physical Exam  Constitutional: He appears well-developed and well-nourished.  Neurological:  Drowsy, no tremors    Review of Systems  Neurological: Positive for dizziness.  Psychiatric/Behavioral: Positive for depression, hallucinations and suicidal ideas. The patient is nervous/anxious and has insomnia.   All other systems reviewed and are negative.   Blood pressure (!) 150/87, pulse (!) 43, temperature 98.2 F (36.8 C), temperature source Oral, resp. rate 14, height 5\' 11"  (1.803 m), weight 198 lb (89.8 kg), SpO2 99 %.Body mass index is 27.62 kg/m.  General Appearance: Casual  Eye Contact:  Minimal  Speech:  Normal Rate  Volume:  Normal   Mood:  Depressed  Affect:  Blunt,   Thought Process:  Coherent Perceptions: AH of lady, VH of shadows  Orientation:  Full (Time, Place, and Person)  Thought Content:  Logical  Suicidal Thoughts:  No  Homicidal Thoughts:  No  Memory:  good   Judgement:  Fair  Insight:  Present  Psychomotor Activity:  Psychomotor Retardation  Concentration:  Concentration: Good and Attention Span: Good  Recall:  Good  Fund of Knowledge:  Fair  Language:  Fair  Akathisia:  No  Handed:  Right  AIMS (if indicated):     Assets:  Communication Skills  ADL's:  Intact  Cognition:  WNL  Sleep:  Number of Hours: 6.5   Assessment 25 year old male with polysubstance use, presented with SI in the context of cocaine and marijuana use. After admission, patient was found to have half of suboxone strip in patients toilet paper roll (patient reports he hid it in his hair upon admission). Later patient was found to have a sheet around his neck tied onto the doorknob at the hallway at the night of admission.   # MDD # r/o substance induced mood disorder Patient reports chronic SI for years before this admission. There has been worsening in his neurovegetative symptoms with passive SI and AH/VH on today's exam; will increase quetiapine to target these symptoms. May consider adding Wellbutrin/or up titration of sertraline in a few days if he continues to have worsening symptoms. He has been  appropriate, and contracts for safety upon unit. Will discontinue visitor restriction (with less concern for others bringing in substances.)  # Cocaine use disorder # Marijuana use disorder Continue on clonidine protocol. Patient is motivated for sobriety. Continue motivational interview.  Plans - Continue sertraline 50 mg daily - Increase Quetiapine 50-50-100 mg qhs - Quetiapine 50 mg q6hprn for agitation, irritability, insomnia - Discontinue visitor restriction - Continue opiate withdrawal protocol with clonidine - Medication  management to reduce current symptoms to base line and improve the patient's overall level of functioning. - Monitor for the adverse effect of the medications and anger outbursts - Continue 15 minutes observation for safety concerns - Encouraged to participate in milieu therapy and group therapy counseling sessions and also work with coping skills - Continue to develop treatment plan to decrease risk of relapse upon discharge and to reduce the need for readmission. -  Psycho-social education regarding relapse prevention and self care. - Health care follow up as needed for medical problems. - Restart home medications where appropriate.  Treatment Plan Summary: Daily contact with patient to assess and evaluate symptoms and progress in treatment  Neysa Hotter, MD 02/24/2016, 1:18 PM

## 2016-02-24 NOTE — Progress Notes (Signed)
Patient attended AA group meeting.  

## 2016-02-24 NOTE — Progress Notes (Signed)
Late entry from 1:30pm:  As CSW was gathering patients for group therapy, CSW spoke with patient who was sitting in the dark in his room alone. Patient asked CSW "how is my family going to be taken care of if I'm gone?" CSW asked patient to clarify his statement but patient appeared guarded and would not provide details. CSW inquired about SI, patient denied and contracted for safety. He reports that he will speak with staff if having urges to harm self. CSW encouraged patient to attend group but patient declined.  CSW spoke with nurse tech to inform him of conversation with patient. Nurse tech agreed to meet with patient individually to provide additional support.  Samuella BruinKristin Dorsie Burich, LCSW Clinical Social Worker [facility] [phone]

## 2016-02-24 NOTE — Progress Notes (Signed)
Patient spoke with nurse tech and asked "if I'm gone will my kids get a check?". This RN approached him to clarify his statement, would not make eye contact. Denied current thoughts of suicide, stating "I'm fine". Verbally contracted for safety and that he would approach staff if he was thinking of self harm. Stated he may call his girlfriend and ask if she and children will visit tonight. Discussed patient behavior and presentation with MD. No 1:1 ordered at this time, will monitor closely.
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
## 2016-02-25 NOTE — [facility] Group Notes (Signed)
[facility] Group Notes:  (Nursing/MHT/Case Management/Adjunct)  Date:  02/25/2016  Time:  3:01 PM  Type of Therapy:  Psychoeducational Skills  Participation Level:  Active  Participation Quality:  Appropriate and Supportive  Affect:  Appropriate  Cognitive:  Alert, Appropriate and Oriented  Insight:  Appropriate and Improving  Engagement in Group:  Improving and Supportive  Modes of Intervention:  Discussion, Exploration and Support  Summary of Progress/Problems: attended group and participated well. Recited a poem he had written for the group.   Vinetta BergamoBarbara M Sultana Tierney 02/25/2016, 3:01 PM
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
## 2016-02-25 NOTE — Progress Notes (Signed)
Discussion with Drs Lucianne MussKumar and Dicky DoeEappen, Tina AC and Agnes NP about patient behavior this morning and decision to move to another unit. Patient and male peer have resolved their differences and apologized to each other, patient has been cooperative, pleasant and appropriate on unit today, participating in all activities. Decision to move patient to 500 hall rescinded.

## 2016-02-25 NOTE — Progress Notes (Signed)
Patient ID: Chad Rojas, male   DOB: 12/04/1990, 25 y.o.   MRN: [id number]  Pt currently presents with a flat affect and labile behavior. Pt is easily upset by perceived provocation. Pt reports to writer that their goal is to "not want to use drugs every day that I wake up." Pt states "I'm doing it for my four kids." Pt reports good sleep with current medication regimen. Half of a cigarette found in the bottom of patients rooms trash can. The room smelled of smoke and the shower was found running. Wet towels found on patients bed during environmental check. Pt doesn't claim the cigarette and states "Well I don't smoke so it's not me."   Pt provided with medications per providers orders. Pt's labs and vitals were monitored throughout the night. Pt supported emotionally and encouraged to express concerns and questions. Pt educated on medications and reoriented to unit rules. AC notified of what was found during environmental's, increased staff supervision on hall.   Pt's safety ensured with 15 minute and environmental checks. Pt currently denies SI/HI and A/V hallucinations. Pt verbally agrees to seek staff if SI/HI or A/VH occurs and to consult with staff before acting on any harmful thoughts. Pt reports that he is unsure of what his discharge plan is and has little to say about where he wants to go. He does express the need for continued detox. Will continue POC.

## 2016-02-25 NOTE — [facility] Group Notes (Signed)
[facility] Group Notes:  (Clinical Social Work)   05/07/2015      Summary of Progress/Problems:   In today's process group a decisional balance exercise was used to explore in depth the perceived benefits and costs of alcohol and drugs, as well as the  benefits and costs of replacing these with healthy coping skills.  Patients listed healthy and unhealthy coping techniques, determining with CSW guidance that unhealthy coping techniques work initially, but eventually become harmful.  Motivational Interviewing and the whiteboard were utilized for the exercises.  The patient expressed that the unhealthy coping he often uses is alcohol and drugs, as well as directing his anger at others.  Healthy coping includes listening to music and poetry/spoken word participation.  He was very interactive, appropriate, and showed growing insight during group.  Type of Therapy:  Group Therapy - Process   Participation Level:  Active  Participation Quality:  Attentive, Sharing and Supportive  Affect:  Depressed  Cognitive:  Appropriate and Oriented  Insight:  Engaged  Engagement in Therapy:  Engaged  Modes of Intervention:  Education, Motivational Interviewing  Pilgrim's PrideMareida Grossman-Orr, LCSW 02/25/2016, 1:32 PM

## 2016-02-26 ENCOUNTER — Encounter (HOSPITAL_COMMUNITY): Payer: Self-pay | Admitting: Psychiatry

## 2016-02-26 DIAGNOSIS — F3289 Other specified depressive episodes: Secondary | ICD-10-CM

## 2016-02-26 DIAGNOSIS — F1310 Sedative, hypnotic or anxiolytic abuse, uncomplicated: Secondary | ICD-10-CM | POA: Clinically undetermined

## 2016-02-26 DIAGNOSIS — F1420 Cocaine dependence, uncomplicated: Secondary | ICD-10-CM | POA: Clinically undetermined

## 2016-02-26 DIAGNOSIS — F19929 Other psychoactive substance use, unspecified with intoxication, unspecified: Secondary | ICD-10-CM

## 2016-02-26 DIAGNOSIS — F1220 Cannabis dependence, uncomplicated: Secondary | ICD-10-CM | POA: Clinically undetermined

## 2016-02-26 DIAGNOSIS — F1120 Opioid dependence, uncomplicated: Secondary | ICD-10-CM | POA: Clinically undetermined

## 2016-02-26 DIAGNOSIS — F1994 Other psychoactive substance use, unspecified with psychoactive substance-induced mood disorder: Secondary | ICD-10-CM | POA: Clinically undetermined

## 2016-02-26 MED ORDER — HYDROXYZINE HCL 25 MG PO TABS: 25.0000 mg | ORAL_TABLET | Freq: Four times a day (QID) | ORAL | 0 refills | Status: DC | PRN

## 2016-02-26 MED ORDER — QUETIAPINE FUMARATE 50 MG PO TABS: 50.0000 mg | ORAL_TABLET | Freq: Two times a day (BID) | ORAL | 0 refills | Status: DC

## 2016-02-26 MED ORDER — QUETIAPINE FUMARATE 100 MG PO TABS: 100.0000 mg | ORAL_TABLET | Freq: Every day | ORAL | 0 refills | Status: DC

## 2016-02-26 MED ORDER — SERTRALINE HCL 50 MG PO TABS: 50.0000 mg | ORAL_TABLET | Freq: Every day | ORAL | 0 refills | Status: DC

## 2016-02-26 NOTE — Progress Notes (Signed)
  [facility] :  Will you be returning to the same living situation after discharge:  Yes,  with girlfriend At discharge, do you have transportation home?: Yes,  arranged Do you have the ability to pay for your medications: Yes,  no issues  Release of information consent forms completed and in the chart;  Patient's signature needed at discharge.  Patient to Follow up at: Follow-up Information    [facility].   Why:  8:00AM - [facility] and [facility] health services. Contact information: [address] [place] [address] [phone]        [facility]. Call on 02/27/2016.   Why:  Call daily to find out if a treatment bed has become available. Contact information: [address] [place], [place] [address]  phone:[phone]        [facility]. Call on 02/27/2016.   Why:  Call daily to find out if a treatment bed has become available. Contact information: [address], [place], [place] [address] [phone]          Next level of care provider has [facility] to [facility] Link:no  Safety Planning and Suicide Prevention discussed: Yes,  with pt and his significant other  Have you used any form of tobacco in the last 30 days? (Cigarettes, Smokeless Tobacco, Cigars, and/or Pipes): Yes  Has patient been referred to the Quitline?: Patient refused referral  Patient has been referred for addiction treatment: Yes  Lynnell ChadMareida J Grossman-Orr 02/26/2016, 9:14 AM

## 2016-02-26 NOTE — Discharge Summary (Signed)
Physician Discharge Summary Note  Patient:  Chad Rojas is an 25 y.o., male MRN:  [id number] DOB:  1990-09-21 Patient phone:  [phone] (home)  Patient address:   [address] [address],  Total Time spent with patient: Greater than 30 minutes  Date of Admission:  02/21/2016  Date of Discharge: 02-26-16  Reason for Admission: Drug detoxification/mood stabilization treatments.  Principal Problem: Substance or medication-induced depressive disorder with onset during intoxication [facility])  Discharge Diagnoses: Patient Active Problem List   Diagnosis Date Noted  . Benzodiazepine abuse [F13.10] 02/26/2016  . Opioid use disorder, moderate, dependence (HCC) [F11.20] 02/26/2016  . Cocaine use disorder, moderate, dependence (HCC) [F14.20] 02/26/2016  . Cannabis use disorder, moderate, dependence (HCC) [F12.20] 02/26/2016  . Substance or medication-induced depressive disorder with onset during intoxication [facility]) [F19.94] 02/26/2016   Past Psychiatric History: Bipolar disorder,   Past Medical History: History reviewed. No pertinent past medical history. History reviewed. No pertinent surgical history.  Family History: History reviewed. No pertinent family history.  Family Psychiatric  History: See H&P  Social History:  History  Alcohol Use  . Yes    Comment: last use last week/drinks approx 1/5th liquor a week     History  Drug Use  . Types: Cocaine, Marijuana    Social History   Social History  . Marital status: Single    Spouse name: N/A  . Number of children: N/A  . Years of education: N/A   Social History Main Topics  . Smoking status: Current Some Day Smoker    Packs/day: 0.25    Years: 0.50    Types: Cigarettes  . Smokeless tobacco: Never Used     Comment: refuses patch/smoked 3 pks in 3 months trying to quit marijuana  . Alcohol use Yes     Comment: last use last week/drinks approx 1/5th liquor a week  . Drug use:     Types: Cocaine,  Marijuana  . Sexual activity: Yes    Birth control/ protection: None   Other Topics Concern  . None   Social History Narrative  . None   Hospital Course: Per admission assessment: This is an admission assessment for Chad Rojas, a 25 year old Caucasian male with hx of polysubstance dependence. Admitted to the [facility] adult unit with complaint of recent relapse on drugs & anger issues. He is currently seeking detoxification/mood stabilization treatments. During this assessment, Chad Rojas reports, "My fiance took me to the [facility] hospital ED yesterday. My back was hurting after I fell down some flight of steps yesterday. I was arguing with my old brother at the time I fell down the flight of stairs. I'm feeling very depressed for few years now. I have never been treated for depression. My brother has always bullied me. He has never cared about me, but care about his friends & his drugs. I use Marijuana & Cocaine daily most of the time. I have been using for a long time. I took some Xanax & Vicodin yesterday. I drink too, but have not had a drink in about a week. I drink once a week. I have not really had any sobriety from substances. I have hx of suicide attempt by hanging when I was 18 (2009). I was hospitalized in this hospital at the adolescence unit. I have no friends, no one wants to be around me. I get very depressed & angry easily. When I talk, people always think that I was yelling. I can't control my anger.  I like to fight with people. I need anger management. My mind/thoughts races till they get in the overdrive. "  Chad Ro[facility] with his UDS test results positive for Benzodiazepine, Opioid, Cocaine & THC. He did admit to having relapsed recently on drugs & has been using multiple substances. He also reported having anger issues. He requested drug detoxification as well as mood stabilization treatments. Chad Rojas was also presenting with symptoms of depression which he stated has  been going on for many years. He has a history of suicide attempt during his teen years & was hospitalized at the time.    After his admission assessment/evaluation, Chad Rojas's presenting symptoms were identified. The medication regimen targeting those symptoms were discussed & initiated with his consent. He received Clonidine detoxification treatment protocols to combat the withdrawal symptoms of opioid. He was also medicated and discharged on; Hydroxyzine 25 mg Q 6 hours prn for anxiety, Seroquel 100 mg Q bedtime for mood control, Seroquel 50 mg bid for agitation & Sertraline 50 mg daily for depression.  He was also enrolled in the group counseling sessions being offered & held on this unit to learn coping skills that should help him further to cope better & manage his depression/substance abuse issues after discharge. Part of his treatment & discharge plans is for a referral/ placement to a long term substance abuse treatment center for further substance abuse treatment.  However, things did not go as planned for Chad Rojas due to his behavioral issues & concerns during his hospitalization. On the night following his admission to the unit & after initiation of his treatment regimen, Chad Rojas was observed during routine checks attempting to hang himself in his bathroom with a sheet tied around his neck. He was upset because his Suboxone strip was taken away from him as he was not supposed to have or take this medication while his opioid detoxification treatment was ongoing. Again Suboxone was not part of his current treatment regimen while in this hospital. He was placed on 1:1 supervision for safety after the hanging incident. This was later discontinued after he felt better & safe. On another incident, Chad Rojas presented upset with the staff & threw his food all over his room. He was also caught smoking in his room knowing that he was informed & instructed that this is a smoke free facility where cigarettes & other  tobacco products are not allowed. He did admit on admission that he was trying to quit smoking & refused the nicotine patch order offered for nicotine cravings. Reports indicated that Chad Rojas continued to be disruptive on the unit & during group sessions. His behaviors were also affecting other patients peace of mind & recovery. He was counseled by the providers just yesterday 02-25-16 & given a choice to either comply with the rules of the unit & not be disruptive or get discharged related to his unfavorable behaviors or be moved to a locked unit where he will be monitored more closely. Chad Rojas chose to stay to complete his treatment as he believed that getting discharged prematurely will not do him any good. He stated that he knew he would get into more trouble once discharged. He was allowed to stay to continue treatment.  Within few hours after the agreement to remain civil with improved behavior, Chad Rojas was caught smoking in his room, see above notes. He continued to choose to sabotage his treatment by doing & acting inapproriately as he pleased.  He is currently being discharged  to his home with family to seek treatment as noted below. Upon discharge, Chad Rojas adamantly denies any SIHI, AVH, delusional thoughts, paranoia or substance withdrawal symptoms. He is provided with prescriptions on all his pertinent medications for his mental health. [facility] in no apparent distress. Transportation per taxi.  Physical Findings: AIMS: Facial and Oral Movements Muscles of Facial Expression: None, normal Lips and Perioral Area: None, normal Jaw: None, normal Tongue: None, normal,Extremity Movements Upper (arms, wrists, hands, fingers): None, normal Lower (legs, knees, ankles, toes): None, normal, Trunk Movements Neck, shoulders, hips: None, normal, Overall Severity Severity of abnormal movements (highest score from questions above): None, normal Incapacitation due to abnormal  movements: None, normal Patient's awareness of abnormal movements (rate only patient's report): No Awareness, Dental Status Current problems with teeth and/or dentures?: No Does patient usually wear dentures?: No  CIWA:  CIWA-Ar Total: 1 COWS:  COWS Total Score: 0  Musculoskeletal: Strength & Muscle Tone: within normal limits Gait & Station: normal Patient leans: N/A  Psychiatric Specialty Exam: Physical Exam  Constitutional: He appears well-developed.  HENT:  Head: Normocephalic.  Eyes: Pupils are equal, round, and reactive to light.  Neck: Normal range of motion.  Cardiovascular: Normal rate.   Respiratory: Effort normal.  Genitourinary:  Genitourinary Comments: Denies any issues in this area  Musculoskeletal: Normal range of motion.  Neurological: He is alert.  Skin: Skin is warm.    Review of Systems  Constitutional: Negative.   HENT: Negative.   Eyes: Negative.   Respiratory: Negative.   Cardiovascular: Negative.   Gastrointestinal: Negative.   Genitourinary: Negative.   Musculoskeletal: Negative.   Skin: Negative.   Neurological: Negative.   Endo/Heme/Allergies: Negative.   Psychiatric/Behavioral: Positive for depression (Improved), hallucinations (Hx. of psychotic issues) and substance abuse (Hx. polysubstance dependence). Negative for memory loss and suicidal ideas. The patient has insomnia (Stable). The patient is not nervous/anxious.     Blood pressure (!) 142/85, pulse 68, temperature 98.3 F (36.8 C), temperature source Oral, resp. rate 18, height 5\' 11"  (1.803 m), weight 89.8 kg (198 lb), SpO2 99 %.Body mass index is 27.62 kg/m.  See Md's SRA   Have you used any form of tobacco in the last 30 days? (Cigarettes, Smokeless Tobacco, Cigars, and/or Pipes): Yes  Has this patient used any form of tobacco in the last 30 days? (Cigarettes, Smokeless Tobacco, Cigars, and/or Pipes) Yes, No  Blood Alcohol level:  Lab Results  Component Value Date   ETH <5  02/20/2016   Metabolic Disorder Labs:  Lab Results  Component Value Date   HGBA1C 5.4 02/22/2016   MPG 108 02/22/2016   MPG 122 10/04/2007   Lab Results  Component Value Date   PROLACTIN 11.1 02/22/2016   Lab Results  Component Value Date   CHOL  10/04/2007    148        ATP III CLASSIFICATION:  <200     mg/dL   Desirable  [id number]  mg/dL   Borderline High  >=914>=240    mg/dL   High   TRIG 782127 95/62/130803/28/2009   HDL 28 (L) 10/04/2007   CHOLHDL 5.3 10/04/2007   VLDL 25 10/04/2007   LDLCALC  10/04/2007    95        Total Cholesterol/HDL:CHD Risk Coronary Heart Disease Risk Table                     Men   Women  1/2 Average Risk  3.4   3.3   See Psychiatric Specialty Exam and Suicide Risk Assessment completed by Attending Physician prior to discharge.  Discharge destination:  Home  Is patient on multiple antipsychotic therapies at discharge:  No   Has Patient had three or more failed trials of antipsychotic monotherapy by history:  No  Recommended Plan for Multiple Antipsychotic Therapies: NA    Medication List    STOP taking these medications   SUBOXONE 8-2 MG Film Generic drug:  Buprenorphine HCl-Naloxone HCl     TAKE these medications     Indication  hydrOXYzine 25 MG tablet Commonly known as:  ATARAX/VISTARIL Take 1 tablet (25 mg total) by mouth every 6 (six) hours as needed for anxiety.  Indication:  Anxiety   QUEtiapine 100 MG tablet Commonly known as:  SEROQUEL Take 1 tablet (100 mg total) by mouth at bedtime. For mood control  Indication:  Mood control   QUEtiapine 50 MG tablet Commonly known as:  SEROQUEL Take 1 tablet (50 mg total) by mouth 2 (two) times daily. For agitation  Indication:  Agitation   sertraline 50 MG tablet Commonly known as:  ZOLOFT Take 1 tablet (50 mg total) by mouth daily. For depression  Indication:  Major Depressive Disorder      Follow-up Information    [facility]. Go on 02/27/2016.   Why:  8:00AM - Go to the  [facility] to receive an assessment and mental health services. Contact information: [address] [place] [address] [phone]        [facility]. Call on 02/27/2016.   Why:  Call daily to find out if a treatment bed has become available. Contact information: [address] [place], [place] [address]  phone:[phone]        [facility]. Call on 02/27/2016.   Why:  Call daily to find out if a treatment bed has become available. Contact information: [address] [place], [place] [address] [phone]         Follow-up recommendations: Activity:  As tolerated Diet: As recommended by your primary care doctor. Keep all scheduled follow-up appointments as recommended.   Comments: Patient is instructed prior to discharge to: Take all medications as prescribed by his/her mental healthcare provider. Report any adverse effects and or reactions from the medicines to his/her outpatient provider promptly. Patient has been instructed & cautioned: To not engage in alcohol and or illegal drug use while on prescription medicines. In the event of worsening symptoms, patient is instructed to call the crisis hotline, 911 and or go to the nearest ED for appropriate evaluation and treatment of symptoms. To follow-up with his/her primary care provider for your other medical issues, concerns and or health care needs.   Signed: Sanjuana Kava, NP, PMHNP, FNP-BC 02/26/2016, 3:29 PM

## 2016-02-26 NOTE — [facility] Suicide Risk Assessment (Signed)
[facility]   Principal Problem: Substance or medication-induced depressive disorder with onset during intoxication [facility](HCC) Discharge Diagnoses:  Patient Active Problem List   Diagnosis Date Noted  . Benzodiazepine abuse [F13.10] 02/26/2016  . Opioid use disorder, moderate, dependence (HCC) [F11.20] 02/26/2016  . Cocaine use disorder, moderate, dependence (HCC) [F14.20] 02/26/2016  . Cannabis use disorder, moderate, dependence (HCC) [F12.20] 02/26/2016  . Substance or medication-induced depressive disorder with onset during intoxication [facility](HCC) [F19.94] 02/26/2016    Total Time spent with patient: 30 minutes  Musculoskeletal: Strength & Muscle Tone: within normal limits Gait & Station: normal Patient leans: N/A  Psychiatric Specialty Exam: Review of Systems  Psychiatric/Behavioral: Positive for substance abuse. Negative for depression and suicidal ideas.  All other systems reviewed and are negative.   Blood pressure (!) 142/85, pulse 68, temperature 98.3 F (36.8 C), temperature source Oral, resp. rate 18, height 5\' 11"  (1.803 m), weight 89.8 kg (198 lb), SpO2 99 %.Body mass index is 27.62 kg/m.  General Appearance: Casual  Eye Contact::  Fair  Speech:  Clear and Coherent409  Volume:  Normal  Mood:  Euthymic  Affect:  Appropriate  Thought Process:  Goal Directed and Descriptions of Associations: Intact  Orientation:  Full (Time, Place, and Person)  Thought Content:  Logical  Suicidal Thoughts:  No  Homicidal Thoughts:  No  Memory:  Immediate;   Fair Recent;   Fair Remote;   Fair  Judgement:  Fair  Insight:  Fair  Psychomotor Activity:  Normal  Concentration:  Fair  Recall:  Fis[employer]Fair  Fund of Knowledge:Fair  Language: Fair  Akathisia:  No  Handed:  Right  AIMS (if indicated):     Assets:  Desire for Improvement  Sleep:  Number of Hours: 5  Cognition: WNL  ADL's:  Intact   Mental Status Per Nursing Assessment::   On Admission:  Suicidal  ideation indicated by patient, Self-harm thoughts  Demographic Factors:  Male and Caucasian  Loss Factors: NA  Historical Factors: Impulsivity  Risk Reduction Factors:   Positive therapeutic relationship  Continued Clinical Symptoms:  Alcohol/Substance Abuse/Dependencies  Cognitive Features That Contribute To Risk:  Polarized thinking    Suicide Risk:  Minimal: No identifiable suicidal ideation.  Patients presenting with no risk factors but with morbid ruminations; may be classified as minimal risk based on the severity of the depressive symptoms  Follow-up Information    [facility]. Go on 02/27/2016.   Why:  8:00AM - [facility] to receive an assessment and mental health services. Contact information: [address] [place] [address] [phone]        [facility]. Call on 02/27/2016.   Why:  Call daily to find out if a treatment bed has become available. Contact information: [address] [place], [place] [address]  phone:[phone]        [facility]. Call on 02/27/2016.   Why:  Call daily to find out if a treatment bed has become available. Contact information: [address], [place], [place] [address] [phone]          Plan Of Care/Follow-up recommendations:  Activity:  no restrictions Diet:  regular Tests:  as needed Other:  follow up with aftercare  Deron Poole, MD 02/26/2016, 9:26 AM

## 2016-02-26 NOTE — Progress Notes (Signed)
Patient ID: Chad Rojas, male   DOB: 02/15/1991, 25 y.o.   MRN: [id number]  Pt. Denies SI/HI and A/V hallucinations. Belongings returned to patient at time of discharge. Discharge instructions and medications were reviewed with patient. Patient verbalized understanding of both medications and discharge instructions. Patient was discharged to taxi which was to transport patient to destination. Patient left in no apparent distress. Q15 minute safety  Checks maintained until discharge.

## 2016-02-26 NOTE — [facility] Group Notes (Signed)
[facility] Group Notes:  (Nursing/MHT/Case Management/Adjunct)  Date:  02/26/2016  Time:  11:03 AM  Type of Therapy:  Psychoeducational Skills  Participation Level:  Minimal  Participation Quality:  Inattentive and Redirectable  Affect:  Blunted  Cognitive:  Appropriate  Insight:  Limited  Engagement in Group:  Lacking and Off Topic  Modes of Intervention:  Clarification, Discussion and Education  Summary of Progress/Problems: Patient attended group and was somewhat inattentive but could be redirected.   December Hedtke E 02/26/2016, 11:03 AM

## 2017-03-05 ENCOUNTER — Ambulatory Visit
Admission: RE | Admit: 2017-03-05 | Discharge: 2017-03-05 | Disposition: A | Discharge status: Home / Self Care | Payer: Medicaid Other | Attending: Family Medicine | Admitting: Family Medicine

## 2017-04-16 ENCOUNTER — Encounter: Payer: Self-pay | Admitting: *Deleted

## 2017-04-16 ENCOUNTER — Emergency Department
Admission: EM | Admit: 2017-04-16 | Discharge: 2017-04-16 | Disposition: A | Discharge status: Home / Self Care | Payer: Medicaid Other | Attending: Emergency Medicine | Admitting: Emergency Medicine

## 2017-04-16 DIAGNOSIS — X58XXXA Exposure to other specified factors, initial encounter: Secondary | ICD-10-CM | POA: Insufficient documentation

## 2017-04-16 DIAGNOSIS — Z79899 Other long term (current) drug therapy: Secondary | ICD-10-CM | POA: Insufficient documentation

## 2017-04-16 DIAGNOSIS — S0501XA Injury of conjunctiva and corneal abrasion without foreign body, right eye, initial encounter: Secondary | ICD-10-CM | POA: Diagnosis not present

## 2017-04-16 DIAGNOSIS — Y9289 Other specified places as the place of occurrence of the external cause: Secondary | ICD-10-CM | POA: Insufficient documentation

## 2017-04-16 DIAGNOSIS — H5711 Ocular pain, right eye: Secondary | ICD-10-CM | POA: Diagnosis present

## 2017-04-16 DIAGNOSIS — Y9389 Activity, other specified: Secondary | ICD-10-CM | POA: Diagnosis not present

## 2017-04-16 DIAGNOSIS — F17210 Nicotine dependence, cigarettes, uncomplicated: Secondary | ICD-10-CM | POA: Insufficient documentation

## 2017-04-16 DIAGNOSIS — Y990 Civilian activity done for income or pay: Secondary | ICD-10-CM | POA: Diagnosis not present

## 2017-04-16 MED ORDER — EYE WASH OPHTH SOLN
1.0000 [drp] | OPHTHALMIC | Status: DC | PRN
  Administered 2017-04-16: 1 [drp] via OPHTHALMIC
  Filled 2017-04-16: qty 118

## 2017-04-16 MED ORDER — ERYTHROMYCIN 5 MG/GM OP OINT: TOPICAL_OINTMENT | Freq: Three times a day (TID) | OPHTHALMIC | 0 refills | Status: AC

## 2017-04-16 MED ORDER — FLUORESCEIN SODIUM 1 MG OP STRP
1.0000 | ORAL_STRIP | Freq: Once | OPHTHALMIC | Status: AC
  Administered 2017-04-16: 1 via OPHTHALMIC
  Filled 2017-04-16: qty 1

## 2017-04-16 MED ORDER — TETRACAINE HCL 0.5 % OP SOLN
1.0000 [drp] | Freq: Once | OPHTHALMIC | Status: AC
  Administered 2017-04-16: 1 [drp] via OPHTHALMIC
  Filled 2017-04-16: qty 4

## 2017-04-16 MED ORDER — KETOROLAC TROMETHAMINE 0.5 % OP SOLN: 1.0000 [drp] | Freq: Four times a day (QID) | OPHTHALMIC | 0 refills | Status: AC

## 2017-04-16 NOTE — ED Triage Notes (Signed)
Pt was at work and a foreign body got into his right eye, eye red and swollen
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
## 2017-04-16 NOTE — Discharge Instructions (Signed)
Take medication as prescribed. Return to emergency department if symptoms worsen and follow-up with PCP as needed.   °

## 2017-04-16 NOTE — ED Notes (Signed)
Electronic signature pad not working at this time. Patient given discharge paperwork and prescriptions. Patient notified of follow up appointments needed. Patient verbalizes understanding. All questions answered. Patient educated on importance of safety glasses as well. Verbalizes understanding.

## 2017-08-05 ENCOUNTER — Emergency Department: Payer: Medicaid Other

## 2017-08-05 ENCOUNTER — Other Ambulatory Visit: Payer: Self-pay

## 2017-08-05 ENCOUNTER — Encounter: Payer: Self-pay | Admitting: Emergency Medicine

## 2017-08-05 ENCOUNTER — Observation Stay
Admission: EM | Admit: 2017-08-05 | Discharge: 2017-08-06 | Disposition: A | Discharge status: Home / Self Care | Payer: Medicaid Other | Attending: Internal Medicine | Admitting: Internal Medicine

## 2017-08-05 DIAGNOSIS — R748 Abnormal levels of other serum enzymes: Secondary | ICD-10-CM | POA: Diagnosis not present

## 2017-08-05 DIAGNOSIS — F1420 Cocaine dependence, uncomplicated: Secondary | ICD-10-CM | POA: Insufficient documentation

## 2017-08-05 DIAGNOSIS — R0902 Hypoxemia: Secondary | ICD-10-CM | POA: Insufficient documentation

## 2017-08-05 DIAGNOSIS — M542 Cervicalgia: Secondary | ICD-10-CM | POA: Insufficient documentation

## 2017-08-05 DIAGNOSIS — R55 Syncope and collapse: Principal | ICD-10-CM | POA: Insufficient documentation

## 2017-08-05 DIAGNOSIS — Z79899 Other long term (current) drug therapy: Secondary | ICD-10-CM | POA: Diagnosis not present

## 2017-08-05 DIAGNOSIS — F17210 Nicotine dependence, cigarettes, uncomplicated: Secondary | ICD-10-CM | POA: Insufficient documentation

## 2017-08-05 DIAGNOSIS — M549 Dorsalgia, unspecified: Secondary | ICD-10-CM | POA: Diagnosis not present

## 2017-08-05 DIAGNOSIS — F329 Major depressive disorder, single episode, unspecified: Secondary | ICD-10-CM | POA: Diagnosis not present

## 2017-08-05 DIAGNOSIS — S199XXA Unspecified injury of neck, initial encounter: Secondary | ICD-10-CM | POA: Insufficient documentation

## 2017-08-05 DIAGNOSIS — S40029A Contusion of unspecified upper arm, initial encounter: Secondary | ICD-10-CM | POA: Insufficient documentation

## 2017-08-05 DIAGNOSIS — F1310 Sedative, hypnotic or anxiolytic abuse, uncomplicated: Secondary | ICD-10-CM | POA: Diagnosis not present

## 2017-08-05 DIAGNOSIS — F1120 Opioid dependence, uncomplicated: Secondary | ICD-10-CM | POA: Insufficient documentation

## 2017-08-05 DIAGNOSIS — R7989 Other specified abnormal findings of blood chemistry: Secondary | ICD-10-CM | POA: Diagnosis present

## 2017-08-05 DIAGNOSIS — F1220 Cannabis dependence, uncomplicated: Secondary | ICD-10-CM | POA: Insufficient documentation

## 2017-08-05 DIAGNOSIS — T796XXA Traumatic ischemia of muscle, initial encounter: Secondary | ICD-10-CM | POA: Diagnosis present

## 2017-08-05 DIAGNOSIS — Z880 Allergy status to penicillin: Secondary | ICD-10-CM | POA: Diagnosis not present

## 2017-08-05 DIAGNOSIS — R778 Other specified abnormalities of plasma proteins: Secondary | ICD-10-CM | POA: Diagnosis present

## 2017-08-05 LAB — CBC WITH DIFFERENTIAL/PLATELET
Basophils Absolute: 0.1 10*3/uL (ref 0–0.1)
Basophils Relative: 1 %
Eosinophils Absolute: 0 10*3/uL (ref 0–0.7)
Eosinophils Relative: 0 %
HCT: 44.2 % (ref 40.0–52.0)
Hemoglobin: 14.9 g/dL (ref 13.0–18.0)
Lymphocytes Relative: 13 %
Lymphs Abs: 1.9 10*3/uL (ref 1.0–3.6)
MCH: 28.6 pg (ref 26.0–34.0)
MCHC: 33.6 g/dL (ref 32.0–36.0)
MCV: 84.9 fL (ref 80.0–100.0)
Monocytes Absolute: 0.9 10*3/uL (ref 0.2–1.0)
Monocytes Relative: 6 %
Neutro Abs: 11.1 10*3/uL — ABNORMAL HIGH (ref 1.4–6.5)
Neutrophils Relative %: 80 %
Platelets: 247 10*3/uL (ref 150–440)
RBC: 5.21 MIL/uL (ref 4.40–5.90)
RDW: 13.3 % (ref 11.5–14.5)
WBC: 14 10*3/uL — ABNORMAL HIGH (ref 3.8–10.6)

## 2017-08-05 LAB — COMPREHENSIVE METABOLIC PANEL
ALT: 39 U/L (ref 17–63)
AST: 44 U/L — ABNORMAL HIGH (ref 15–41)
Albumin: 5 g/dL (ref 3.5–5.0)
Alkaline Phosphatase: 37 U/L — ABNORMAL LOW (ref 38–126)
Anion gap: 9 (ref 5–15)
BUN: 15 mg/dL (ref 6–20)
CO2: 24 mmol/L (ref 22–32)
Calcium: 9.2 mg/dL (ref 8.9–10.3)
Chloride: 106 mmol/L (ref 101–111)
Creatinine, Ser: 1.04 mg/dL (ref 0.61–1.24)
GFR calc Af Amer: >60 mL/min (ref >60)
GFR calc non Af Amer: >60 mL/min (ref >60)
Glucose, Bld: 86 mg/dL (ref 65–99)
Potassium: 3.8 mmol/L (ref 3.5–5.1)
Sodium: 139 mmol/L (ref 135–145)
Total Bilirubin: 1 mg/dL (ref 0.3–1.2)
Total Protein: 8.1 g/dL (ref 6.5–8.1)

## 2017-08-05 LAB — CKMB (ARMC ONLY): CK, MB: 19.5 ng/mL — ABNORMAL HIGH (ref 0.5–5.0)

## 2017-08-05 LAB — URINE DRUG SCREEN, QUALITATIVE (ARMC ONLY)
Amphetamines, Ur Screen: NOT DETECTED
Barbiturates, Ur Screen: NOT DETECTED
Benzodiazepine, Ur Scrn: NOT DETECTED
Cannabinoid 50 Ng, Ur ~~LOC~~: POSITIVE — AB
Cocaine Metabolite,Ur ~~LOC~~: NOT DETECTED
MDMA (Ecstasy)Ur Screen: NOT DETECTED
Methadone Scn, Ur: NOT DETECTED
Opiate, Ur Screen: NOT DETECTED
Phencyclidine (PCP) Ur S: NOT DETECTED
Tricyclic, Ur Screen: NOT DETECTED

## 2017-08-05 LAB — ETHANOL: Alcohol, Ethyl (B): <10 mg/dL (ref <10)

## 2017-08-05 LAB — TROPONIN I
Troponin I: 0.06 ng/mL (ref <0.03)
Troponin I: 0.07 ng/mL (ref <0.03)

## 2017-08-05 MED ORDER — ENOXAPARIN SODIUM 40 MG/0.4ML ~~LOC~~ SOLN
40.0000 mg | SUBCUTANEOUS | Status: DC
  Administered 2017-08-06: 40 mg via SUBCUTANEOUS
  Filled 2017-08-05: qty 0.4

## 2017-08-05 MED ORDER — ONDANSETRON HCL 4 MG/2ML IJ SOLN: 4.0000 mg | Freq: Four times a day (QID) | INTRAMUSCULAR | Status: DC | PRN

## 2017-08-05 MED ORDER — ACETAMINOPHEN 650 MG RE SUPP: 650.0000 mg | Freq: Four times a day (QID) | RECTAL | Status: DC | PRN

## 2017-08-05 MED ORDER — ONDANSETRON HCL 4 MG PO TABS: 4.0000 mg | ORAL_TABLET | Freq: Four times a day (QID) | ORAL | Status: DC | PRN

## 2017-08-05 MED ORDER — SODIUM CHLORIDE 0.9 % IV BOLUS (SEPSIS): 1000.0000 mL | Freq: Once | INTRAVENOUS | Status: DC

## 2017-08-05 MED ORDER — SODIUM CHLORIDE 0.9 % IV SOLN
INTRAVENOUS | Status: AC
  Administered 2017-08-06: 01:00:00 via INTRAVENOUS

## 2017-08-05 MED ORDER — ACETAMINOPHEN 325 MG PO TABS
650.0000 mg | ORAL_TABLET | Freq: Four times a day (QID) | ORAL | Status: DC | PRN
  Administered 2017-08-06: 650 mg via ORAL
  Filled 2017-08-05: qty 2

## 2017-08-05 MED ORDER — KETOROLAC TROMETHAMINE 15 MG/ML IJ SOLN
15.0000 mg | Freq: Four times a day (QID) | INTRAMUSCULAR | Status: DC | PRN
  Administered 2017-08-06: 15 mg via INTRAVENOUS
  Filled 2017-08-05: qty 1
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
## 2017-08-05 NOTE — ED Notes (Signed)
Date and time results received: 08/05/17 1953 (use smartphrase ".now" to insert current time)  Test: Troponin Critical Value:0.06 Name of Provider Notified: Leonides SchanzWoods, J PA-C Orders Received? Or Actions Taken?: Actions Taken: PA made aware no new orders received at present
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
## 2017-08-06 ENCOUNTER — Observation Stay
Admit: 2017-08-06 | Discharge: 2017-08-06 | Disposition: A | Discharge status: Home / Self Care | Payer: Medicaid Other | Attending: Internal Medicine | Admitting: Internal Medicine

## 2017-08-06 ENCOUNTER — Other Ambulatory Visit: Payer: Self-pay

## 2017-08-06 LAB — BASIC METABOLIC PANEL
Anion gap: 7 (ref 5–15)
BUN: 13 mg/dL (ref 6–20)
CHLORIDE: 106 mmol/L (ref 101–111)
CO2: 25 mmol/L (ref 22–32)
CREATININE: 1.08 mg/dL (ref 0.61–1.24)
Calcium: 8.6 mg/dL — ABNORMAL LOW (ref 8.9–10.3)
GFR calc Af Amer: >60 mL/min (ref >60)
GFR calc non Af Amer: >60 mL/min (ref >60)
GLUCOSE: 107 mg/dL — AB (ref 65–99)
POTASSIUM: 3.5 mmol/L (ref 3.5–5.1)
SODIUM: 138 mmol/L (ref 135–145)

## 2017-08-06 LAB — CBC
HEMATOCRIT: 41 % (ref 40.0–52.0)
Hemoglobin: 13.8 g/dL (ref 13.0–18.0)
MCH: 29.1 pg (ref 26.0–34.0)
MCHC: 33.7 g/dL (ref 32.0–36.0)
MCV: 86.2 fL (ref 80.0–100.0)
PLATELETS: 217 10*3/uL (ref 150–440)
RBC: 4.75 MIL/uL (ref 4.40–5.90)
RDW: 13.3 % (ref 11.5–14.5)
WBC: 6 10*3/uL (ref 3.8–10.6)

## 2017-08-06 LAB — TROPONIN I
TROPONIN I: 0.03 ng/mL — AB (ref <0.03)
Troponin I: 0.04 ng/mL (ref <0.03)

## 2017-08-06 MED ORDER — INFLUENZA VAC SPLIT QUAD 0.5 ML IM SUSY: 0.5000 mL | PREFILLED_SYRINGE | INTRAMUSCULAR | Status: DC

## 2017-08-06 MED ORDER — NICOTINE 21 MG/24HR TD PT24: 21.0000 mg | MEDICATED_PATCH | TRANSDERMAL | 1 refills | Status: DC
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
## 2017-08-06 NOTE — Progress Notes (Signed)
*  PRELIMINARY RESULTS* Echocardiogram 2D Echocardiogram has been performed.  Joanette GulaJoan M Anamari Galeas 08/06/2017, 9:44 AM
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
## 2017-08-06 NOTE — Care Management (Signed)
CM consult for medication assistance.  Patient has full active medicaid and medication coverage.  He is not discharging on any new medications.  No needs identified

## 2017-08-06 NOTE — Care Management (Signed)
During progression informed that anticipate discharge today

## 2017-08-07 LAB — ECHOCARDIOGRAM COMPLETE
Height: 71 in
Weight: 3230.4 oz

## 2017-08-07 LAB — HIV ANTIBODY (ROUTINE TESTING W REFLEX): HIV Screen 4th Generation wRfx: NONREACTIVE

## 2017-08-10 ENCOUNTER — Emergency Department
Admission: EM | Admit: 2017-08-10 | Discharge: 2017-08-10 | Disposition: A | Discharge status: Home / Self Care | Payer: Medicaid Other | Attending: Emergency Medicine | Admitting: Emergency Medicine

## 2017-08-10 ENCOUNTER — Other Ambulatory Visit: Payer: Self-pay

## 2017-08-10 ENCOUNTER — Emergency Department: Payer: Medicaid Other

## 2017-08-10 DIAGNOSIS — F17210 Nicotine dependence, cigarettes, uncomplicated: Secondary | ICD-10-CM | POA: Insufficient documentation

## 2017-08-10 DIAGNOSIS — S20212D Contusion of left front wall of thorax, subsequent encounter: Secondary | ICD-10-CM | POA: Diagnosis not present

## 2017-08-10 DIAGNOSIS — R079 Chest pain, unspecified: Secondary | ICD-10-CM | POA: Diagnosis present

## 2017-08-10 LAB — BASIC METABOLIC PANEL
ANION GAP: 7 (ref 5–15)
BUN: 15 mg/dL (ref 6–20)
CALCIUM: 8.8 mg/dL — AB (ref 8.9–10.3)
CO2: 26 mmol/L (ref 22–32)
Chloride: 108 mmol/L (ref 101–111)
Creatinine, Ser: 1.16 mg/dL (ref 0.61–1.24)
GFR calc Af Amer: >60 mL/min (ref >60)
GFR calc non Af Amer: >60 mL/min (ref >60)
GLUCOSE: 113 mg/dL — AB (ref 65–99)
POTASSIUM: 3.6 mmol/L (ref 3.5–5.1)
Sodium: 141 mmol/L (ref 135–145)

## 2017-08-10 LAB — CBC
HEMATOCRIT: 39.7 % — AB (ref 40.0–52.0)
HEMOGLOBIN: 13.9 g/dL (ref 13.0–18.0)
MCH: 29.3 pg (ref 26.0–34.0)
MCHC: 34.9 g/dL (ref 32.0–36.0)
MCV: 84 fL (ref 80.0–100.0)
Platelets: 247 10*3/uL (ref 150–440)
RBC: 4.73 MIL/uL (ref 4.40–5.90)
RDW: 13.4 % (ref 11.5–14.5)
WBC: 8.9 10*3/uL (ref 3.8–10.6)

## 2017-08-10 LAB — CULTURE, BLOOD (ROUTINE X 2)
Culture: NO GROWTH
Culture: NO GROWTH
Special Requests: ADEQUATE

## 2017-08-10 LAB — TROPONIN I: Troponin I: <0.03 ng/mL (ref <0.03)

## 2017-08-10 MED ORDER — LIDOCAINE 5 % EX PTCH: 1.0000 | MEDICATED_PATCH | Freq: Two times a day (BID) | CUTANEOUS | 0 refills | Status: DC

## 2017-08-10 MED ORDER — CYCLOBENZAPRINE HCL 10 MG PO TABS: 10.0000 mg | ORAL_TABLET | Freq: Three times a day (TID) | ORAL | 0 refills | Status: DC | PRN
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
## 2017-08-10 NOTE — ED Notes (Signed)
ED Provider at bedside. 
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
## 2017-12-06 ENCOUNTER — Emergency Department
Admission: EM | Admit: 2017-12-06 | Discharge: 2017-12-07 | Disposition: A | Discharge status: Home / Self Care | Payer: No Typology Code available for payment source | Attending: Student in an Organized Health Care Education/Training Program | Admitting: Student in an Organized Health Care Education/Training Program

## 2017-12-06 ENCOUNTER — Encounter: Payer: Self-pay | Admitting: *Deleted

## 2017-12-06 ENCOUNTER — Emergency Department: Payer: No Typology Code available for payment source

## 2017-12-06 ENCOUNTER — Other Ambulatory Visit: Payer: Self-pay

## 2017-12-06 DIAGNOSIS — Y939 Activity, unspecified: Secondary | ICD-10-CM | POA: Insufficient documentation

## 2017-12-06 DIAGNOSIS — S0990XA Unspecified injury of head, initial encounter: Secondary | ICD-10-CM | POA: Insufficient documentation

## 2017-12-06 DIAGNOSIS — Y92410 Unspecified street and highway as the place of occurrence of the external cause: Secondary | ICD-10-CM | POA: Diagnosis not present

## 2017-12-06 DIAGNOSIS — R51 Headache: Secondary | ICD-10-CM

## 2017-12-06 DIAGNOSIS — F17210 Nicotine dependence, cigarettes, uncomplicated: Secondary | ICD-10-CM | POA: Insufficient documentation

## 2017-12-06 DIAGNOSIS — Y998 Other external cause status: Secondary | ICD-10-CM | POA: Insufficient documentation

## 2017-12-06 DIAGNOSIS — F10129 Alcohol abuse with intoxication, unspecified: Secondary | ICD-10-CM | POA: Insufficient documentation

## 2017-12-06 DIAGNOSIS — F1910 Other psychoactive substance abuse, uncomplicated: Secondary | ICD-10-CM

## 2017-12-06 DIAGNOSIS — R519 Headache, unspecified: Secondary | ICD-10-CM

## 2017-12-06 LAB — CBC WITH DIFFERENTIAL/PLATELET
BASOS ABS: 0.1 10*3/uL (ref 0–0.1)
Basophils Relative: 1 %
EOS PCT: 4 %
Eosinophils Absolute: 0.4 10*3/uL (ref 0–0.7)
HEMATOCRIT: 41 % (ref 40.0–52.0)
Hemoglobin: 14.1 g/dL (ref 13.0–18.0)
LYMPHS ABS: 2.3 10*3/uL (ref 1.0–3.6)
LYMPHS PCT: 26 %
MCH: 29.8 pg (ref 26.0–34.0)
MCHC: 34.3 g/dL (ref 32.0–36.0)
MCV: 86.8 fL (ref 80.0–100.0)
MONO ABS: 0.6 10*3/uL (ref 0.2–1.0)
MONOS PCT: 7 %
Neutro Abs: 5.5 10*3/uL (ref 1.4–6.5)
Neutrophils Relative %: 62 %
PLATELETS: 241 10*3/uL (ref 150–440)
RBC: 4.72 MIL/uL (ref 4.40–5.90)
RDW: 13.6 % (ref 11.5–14.5)
WBC: 8.8 10*3/uL (ref 3.8–10.6)

## 2017-12-06 LAB — COMPREHENSIVE METABOLIC PANEL
ALT: 20 U/L (ref 17–63)
ANION GAP: 9 (ref 5–15)
AST: 22 U/L (ref 15–41)
Albumin: 4 g/dL (ref 3.5–5.0)
Alkaline Phosphatase: 43 U/L (ref 38–126)
BILIRUBIN TOTAL: 0.9 mg/dL (ref 0.3–1.2)
BUN: 9 mg/dL (ref 6–20)
CHLORIDE: 105 mmol/L (ref 101–111)
CO2: 26 mmol/L (ref 22–32)
Calcium: 8.8 mg/dL — ABNORMAL LOW (ref 8.9–10.3)
Creatinine, Ser: 0.87 mg/dL (ref 0.61–1.24)
Glucose, Bld: 92 mg/dL (ref 65–99)
POTASSIUM: 3.4 mmol/L — AB (ref 3.5–5.1)
Sodium: 140 mmol/L (ref 135–145)
TOTAL PROTEIN: 7.3 g/dL (ref 6.5–8.1)

## 2017-12-06 MED ORDER — FLUORESCEIN SODIUM 1 MG OP STRP
1.0000 | ORAL_STRIP | Freq: Once | OPHTHALMIC | Status: AC
  Administered 2017-12-07: 1 via OPHTHALMIC
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
## 2017-12-07 NOTE — Discharge Instructions (Addendum)

## 2018-03-09 ENCOUNTER — Encounter: Payer: Self-pay | Admitting: Emergency Medicine

## 2018-03-09 ENCOUNTER — Emergency Department
Admission: EM | Admit: 2018-03-09 | Discharge: 2018-03-09 | Disposition: A | Discharge status: Home / Self Care | Payer: Self-pay | Attending: Emergency Medicine | Admitting: Emergency Medicine

## 2018-03-09 ENCOUNTER — Other Ambulatory Visit: Payer: Self-pay

## 2018-03-09 DIAGNOSIS — S0501XA Injury of conjunctiva and corneal abrasion without foreign body, right eye, initial encounter: Secondary | ICD-10-CM | POA: Insufficient documentation

## 2018-03-09 DIAGNOSIS — Y998 Other external cause status: Secondary | ICD-10-CM | POA: Insufficient documentation

## 2018-03-09 DIAGNOSIS — F17210 Nicotine dependence, cigarettes, uncomplicated: Secondary | ICD-10-CM | POA: Insufficient documentation

## 2018-03-09 DIAGNOSIS — F1220 Cannabis dependence, uncomplicated: Secondary | ICD-10-CM | POA: Insufficient documentation

## 2018-03-09 DIAGNOSIS — Y9389 Activity, other specified: Secondary | ICD-10-CM | POA: Insufficient documentation

## 2018-03-09 DIAGNOSIS — F1120 Opioid dependence, uncomplicated: Secondary | ICD-10-CM | POA: Insufficient documentation

## 2018-03-09 DIAGNOSIS — Y929 Unspecified place or not applicable: Secondary | ICD-10-CM | POA: Insufficient documentation

## 2018-03-09 DIAGNOSIS — F1420 Cocaine dependence, uncomplicated: Secondary | ICD-10-CM | POA: Insufficient documentation

## 2018-03-09 DIAGNOSIS — X58XXXA Exposure to other specified factors, initial encounter: Secondary | ICD-10-CM | POA: Insufficient documentation

## 2018-03-09 MED ORDER — HYDROCODONE-ACETAMINOPHEN 5-325 MG PO TABS
1.0000 | ORAL_TABLET | Freq: Once | ORAL | Status: AC
  Administered 2018-03-09: 1 via ORAL
  Filled 2018-03-09: qty 1

## 2018-03-09 MED ORDER — TETRACAINE HCL 0.5 % OP SOLN
1.0000 [drp] | Freq: Once | OPHTHALMIC | Status: AC
  Administered 2018-03-09: 1 [drp] via OPHTHALMIC
  Filled 2018-03-09: qty 4

## 2018-03-09 MED ORDER — HYDROCODONE-ACETAMINOPHEN 5-325 MG PO TABS: 1.0000 | ORAL_TABLET | Freq: Four times a day (QID) | ORAL | 0 refills | Status: AC | PRN

## 2018-03-09 MED ORDER — FLUORESCEIN SODIUM 1 MG OP STRP
1.0000 | ORAL_STRIP | Freq: Once | OPHTHALMIC | Status: AC
  Administered 2018-03-09: 1 via OPHTHALMIC
  Filled 2018-03-09: qty 1

## 2018-03-09 MED ORDER — TOBRAMYCIN 0.3 % OP SOLN: 2.0000 [drp] | OPHTHALMIC | Status: DC

## 2018-03-09 MED ORDER — EYE WASH OPHTH SOLN
1.0000 [drp] | OPHTHALMIC | Status: DC | PRN
  Administered 2018-03-09: 1 [drp] via OPHTHALMIC
  Filled 2018-03-09: qty 118

## 2018-03-09 MED ORDER — MOXIFLOXACIN HCL 0.5 % OP SOLN
1.0000 [drp] | Freq: Four times a day (QID) | OPHTHALMIC | Status: DC
  Administered 2018-03-09: 1 [drp] via OPHTHALMIC
  Filled 2018-03-09: qty 3

## 2018-03-09 NOTE — ED Provider Notes (Signed)
[facility] Emergency Department Provider Note   ____________________________________________   First MD Initiated Contact with Patient 03/09/18 1354     (approximate)  I have reviewed the triage vital signs and the nursing notes.   HISTORY  Chief Complaint Foreign Body in Eye   HPI Chad Rojas is a 27 y.o. male presents to the emergency department with complaint of right eye.  Redness and pain.  Patient states that both eyes have been watering.  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  He states that he was able to irrigate the eye.  Although he was uncomfortable yesterday he believes that it would continue to get better.  This morning he woke with his lashes matted shut and increased sensitivity to light.   History reviewed. No pertinent past medical history.  Patient Active Problem List   Diagnosis Date Noted  . Syncope 08/05/2017  . Elevated troponin 08/05/2017  . Benzodiazepine abuse (HCC) 02/26/2016  . Opioid use disorder, moderate, dependence (HCC) 02/26/2016  . Cocaine use disorder, moderate, dependence (HCC) 02/26/2016  . Cannabis use disorder, moderate, dependence (HCC) 02/26/2016  . 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 NO PAST SURGERIES      Prior to Admission medications   Medication Sig Start Date End Date Taking? Authorizing Provider  HYDROcodone-acetaminophen (NORCO/VICODIN) 5-325 MG tablet Take 1 tablet by mouth every 6 (six) hours as needed for moderate pain. 03/09/18   Tommi Rumps, PA-C    Allergies Penicillins  Family History  Family history unknown: Yes    Social History Social History   Tobacco Use  . Smoking status: Current Some Day Smoker    Packs/day: 0.25    Years: 0.50    Pack years: 0.12    Types:  Cigarettes  . Smokeless tobacco: Never Used  . Tobacco comment: refuses patch/smoked 3 pks in 3 months trying to quit marijuana  Substance Use Topics  . Alcohol use: Yes    Comment: last use last week/drinks approx 1/5th liquor a week  . Drug use: Yes    Types: Cocaine, Marijuana    Comment: Pt last used cocaine 1-2 months ago    Review of Systems Constitutional: No fever/chills Eyes: Right eye foreign body sensation.  Right eye photophobia. ENT: No sore throat. Cardiovascular: Denies chest pain. Respiratory: Denies shortness of breath. Musculoskeletal: Negative for muscle skeletal pain. Skin: Positive for multiple abrasions of the face. Neurological: Negative for headaches, focal weakness or numbness. ___________________________________________   PHYSICAL EXAM:  VITAL SIGNS: ED Triage Vitals  Enc Vitals Group     BP 03/09/18 1334 128/72     Pulse Rate 03/09/18 1334 84     Resp 03/09/18 1334 16     Temp 03/09/18 1334 98.7 F (37.1 C)     Temp Source 03/09/18 1334 Oral     SpO2 03/09/18 1334 98 %     Weight 03/09/18 1335 200 lb (90.7 kg)     Height 03/09/18 1335 5\' 11"  (1.803 m)     Head Circumference --      Peak Flow --      Pain Score 03/09/18 1334 10     Pain Loc --      Pain Edu? --      Excl. in GC? --    Constitutional:  Alert and oriented. Well appearing and in no acute distress. Eyes: Patient is markedly photophobic in the exam room.  There is some minimal yellow thick exudate in the lashes.  Lid was inverted and no foreign body was noted.  Conjunctive is moderately injected.  Tetracaine 2 drops was placed in the eye.  PERRL  EOMI no foreign body noted.  Fluorescein dye was applied.  Corneal abrasion is present.  Fluorescein uptake involves lower two thirds of the cornea.  Eye was flushed with ophthalmic wash.  Head: Atraumatic. Nose: No congestion/rhinnorhea. Neck: No stridor.   Cardiovascular: Normal rate, regular rhythm. Grossly normal heart sounds.  Good  peripheral circulation. Respiratory: Normal respiratory effort.  No retractions. Lungs CTAB. Neurologic:  Normal speech and language. No gross focal neurologic deficits are appreciated.  Skin:  Skin is warm, dry.  There are multiple superficial linear abrasions noted to the face due to patient working near a Energy manager. Psychiatric: Mood and affect are normal. Speech and behavior are normal.  ____________________________________________   LABS (all labs ordered are listed, but only abnormal results are displayed)  Labs Reviewed - No data to display  PROCEDURES  Procedure(s) performed: None  Procedures  Critical Care performed: No  ____________________________________________   INITIAL IMPRESSION / ASSESSMENT AND PLAN / ED COURSE  As part of my medical decision making, I reviewed the following data within the electronic MEDICAL RECORD NUMBER Notes from prior ED visits and [place] Controlled Substance Database  Patient presents to the emergency department with complaint of right eye pain.  Patient states that he has a foreign body sensation in his right eye and that today he has increased photophobia.  Patient was working around a Proofreader.  Examination does reveal a large corneal abrasion.  Patient was given hydrocodone while in the emergency department and also Vigamox ophthalmic to begin using 4 times a day.  After discussing this with Dr. Druscilla Brownie he will be seen tomorrow morning September 2 at 9:30 AM.  Patient was made aware of the entrance to the building would be on the right side door as the office will be closed.  A prescription for Norco was sent to [facility] in [place].  ____________________________________________   FINAL CLINICAL IMPRESSION(S) / ED DIAGNOSES  Final diagnoses:  Abrasion of right cornea, initial encounter     ED Discharge Orders         Ordered    HYDROcodone-acetaminophen (NORCO/VICODIN) 5-325 MG tablet  Every 6 hours PRN     03/09/18 1417             Note:  This document was prepared using Dragon voice recognition software and may include unintentional dictation errors.    Tommi Rumps, PA-C 03/09/18 1637    Sharyn Creamer, MD 03/09/18 [phone]

## 2018-03-09 NOTE — Discharge Instructions (Addendum)
You will need to follow-up with Dr. Druscilla Brownie at the [facility] Monday morning at 9:30 AM.  The office will be closed and you will need to go into the building on the right side door.  Wear sunglasses when exposed to bright light.  Begin using Vigamox 1 drop 4 times a day until you see Dr. Druscilla Brownie.  A prescription for hydrocodone was sent to your pharmacy.  This medication is every 6 hours if needed for pain.  This medication could cause drowsiness and increase your risk for injury.

## 2018-03-09 NOTE — ED Notes (Signed)
Pharmacy called for medication.
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
## 2018-03-09 NOTE — ED Triage Notes (Signed)
Pt to ED via POV, pt states that he was working in the woods and thinks he may have a piece of bark in his eye. Pt has stretches on bilateral arms. Pt is in NAD at this time.

## 2018-04-08 DEATH — deceased
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
